# Patient Record
Sex: Female | Born: 1980 | Race: White | Hispanic: No | Marital: Single | Smoking: Former smoker
Health system: Southern US, Community
[De-identification: ages and names within clinical notes are randomized; demographics above are authoritative.]

## PROBLEM LIST (undated history)

## (undated) HISTORY — PX: LEEP: SHX91

---

## 2021-12-06 ENCOUNTER — Encounter: Payer: Self-pay | Admitting: Medical

## 2021-12-06 ENCOUNTER — Ambulatory Visit: Payer: Medicaid Other | Admitting: Medical

## 2021-12-06 VITALS — BP 113/56 | HR 55 | Temp 98.2°F | Resp 18 | Ht 66.0 in | Wt 186.0 lb

## 2021-12-06 DIAGNOSIS — F3289 Other specified depressive episodes: Secondary | ICD-10-CM | POA: Diagnosis not present

## 2021-12-06 DIAGNOSIS — R87618 Other abnormal cytological findings on specimens from cervix uteri: Secondary | ICD-10-CM

## 2021-12-06 DIAGNOSIS — R519 Headache, unspecified: Secondary | ICD-10-CM

## 2021-12-06 DIAGNOSIS — Z0000 Encounter for general adult medical examination without abnormal findings: Secondary | ICD-10-CM

## 2021-12-06 DIAGNOSIS — R5383 Other fatigue: Secondary | ICD-10-CM | POA: Diagnosis not present

## 2021-12-06 DIAGNOSIS — E669 Obesity, unspecified: Secondary | ICD-10-CM

## 2021-12-06 NOTE — Patient Instructions (Addendum)
For you wellness exam today I have ordered cbc, cmp and lipid panel.  Adding fatigue labs today as well.   Flu vaccine declined.  Recommend exercise and healthy diet.  We will let you know lab results as they come in.  Follow up date appointment will be determined after lab review.    For depression that is stable continue wellbutrin. Will monitor closely. If mood worsens then consider referral to psychiatrist.  For abnormal pap placed referral to gynecologist.  For your atypical head pressure/twitching and burning sensation and transient second blurry vision decided to go ahead and place referral to neurologist.  I think  hey will do work-up and that would probably include EEG.  No known history of seizures but this could be in the differential.  I do think it beneficial to find out in light of the fact that you are on Wellbutrin.  You have tried various antidepressants in the past with no success.  So would recommend continuing Wellbutrin.  If you have any obvious overt seizure type symptoms then I would recommend stopping the Wellbutrin.  Presently recommend continuing pending neurologist work-up.     Preventive Care 10-61 Years Old, Female Preventive care refers to lifestyle choices and visits with your health care provider that can promote health and wellness. Preventive care visits are also called wellness exams. What can I expect for my preventive care visit? Counseling During your preventive care visit, your health care provider may ask about your: Medical history, including: Past medical problems. Family medical history. Pregnancy history. Current health, including: Menstrual cycle. Method of birth control. Emotional well-being. Home life and relationship well-being. Sexual activity and sexual health. Lifestyle, including: Alcohol, nicotine or tobacco, and drug use. Access to firearms. Diet, exercise, and sleep habits. Work and work Statistician. Sunscreen  use. Safety issues such as seatbelt and bike helmet use. Physical exam Your health care provider may check your: Height and weight. These may be used to calculate your BMI (body mass index). BMI is a measurement that tells if you are at a healthy weight. Waist circumference. This measures the distance around your waistline. This measurement also tells if you are at a healthy weight and may help predict your risk of certain diseases, such as type 2 diabetes and high blood pressure. Heart rate and blood pressure. Body temperature. Skin for abnormal spots. What immunizations do I need? Vaccines are usually given at various ages, according to a schedule. Your health care provider will recommend vaccines for you based on your age, medical history, and lifestyle or other factors, such as travel or where you work. What tests do I need? Screening Your health care provider may recommend screening tests for certain conditions. This may include: Pelvic exam and Pap test. Lipid and cholesterol levels. Diabetes screening. This is done by checking your blood sugar (glucose) after you have not eaten for a while (fasting). Hepatitis B test. Hepatitis C test. HIV (human immunodeficiency virus) test. STI (sexually transmitted infection) testing, if you are at risk. BRCA-related cancer screening. This may be done if you have a family history of breast, ovarian, tubal, or peritoneal cancers. Talk with your health care provider about your test results, treatment options, and if necessary, the need for more tests. Follow these instructions at home: Eating and drinking  Eat a healthy diet that includes fresh fruits and vegetables, whole grains, lean protein, and low-fat dairy products. Take vitamin and mineral supplements as recommended by your health care provider. Do not drink alcohol  if: Your health care provider tells you not to drink. You are pregnant, may be pregnant, or are planning to become  pregnant. If you drink alcohol: Limit how much you have to 0-1 drink a day. Know how much alcohol is in your drink. In the [place] one drink equals one 12 oz bottle of beer (355 mL), one 5 oz glass of wine (148 mL), or one 1 oz glass of hard liquor (44 mL). Lifestyle Brush your teeth every morning and night with fluoride toothpaste. Floss one time each day. Exercise for at least 30 minutes 5 or more days each week. Do not use any products that contain nicotine or tobacco. These products include cigarettes, chewing tobacco, and vaping devices, such as e-cigarettes. If you need help quitting, ask your health care provider. Do not use drugs. If you are sexually active, practice safe sex. Use a condom or other form of protection to prevent STIs. If you do not wish to become pregnant, use a form of birth control. If you plan to become pregnant, see your health care provider for a prepregnancy visit. Find healthy ways to manage stress, such as: Meditation, yoga, or listening to music. Journaling. Talking to a trusted person. Spending time with friends and family. Minimize exposure to UV radiation to reduce your risk of skin cancer. Safety Always wear your seat belt while driving or riding in a vehicle. Do not drive: If you have been drinking alcohol. Do not ride with someone who has been drinking. If you have been using any mind-altering substances or drugs. While texting. When you are tired or distracted. Wear a helmet and other protective equipment during sports activities. If you have firearms in your house, make sure you follow all gun safety procedures. Seek help if you have been physically or sexually abused. What's next? Go to your health care provider once a year for an annual wellness visit. Ask your health care provider how often you should have your eyes and teeth checked. Stay up to date on all vaccines. This information is not intended to replace advice given to you by your  health care provider. Make sure you discuss any questions you have with your health care provider. Document Revised: 05/01/2021 Document Reviewed: 05/01/2021 Elsevier Patient Education  [place].

## 2021-12-06 NOTE — Progress Notes (Addendum)
Subjective:    Patient ID: Kristen Gibbs, female    DOB: [birth year], 41 y.o.   MRN: [id number]  HPI  Pt in for first time. Decided to go ahead and do wellness exam today.    She just moved to area. Move from [place] to [place]. Pt not working presently. Pt wants to exercise regularly.   Up to date on tdap. Flu vaccine and covid vaccine postponed. Due for repeat pap.  Pt wants to loose weight. She states has tried but has been unsuccessful. She states with diet and exercise for one year lost at most lost 5 lbs.   She states week of Thanksgiving she gained weight back.   Pt states just want to lose 15-20 lbs.   Pt has left side adnexal pain. Hx of cyst before and she states has had before.   Also has hx of abnormal pap. She was supposed to have repeat pap after leep procedure. She is overdue for pap.  Depression- controlled with wellbutrin. Has been on med for more than a year.Pt last primary care was handling.   Pt mentioned at end of exam she has random fluttering brain sensation(head pressure) and very slight transient blurred vision. This has happened for 2 months. Feels off and confused for 2 seconds. No hx of brain trauma. No known hx of seizure. Has happened 3 times. No ha after event. No arm twichting.  Evaluate and treat.  Review of Systems  Constitutional:  Positive for fatigue. Negative for chills and fever.       Can't loose weight.   Minimal fatigue now but had been worse before used wellbutrin.  HENT:  Negative for congestion, drooling, ear pain, facial swelling and hearing loss.   Respiratory:  Negative for cough, chest tightness, shortness of breath and wheezing.   Cardiovascular:  Negative for chest pain and palpitations.  Gastrointestinal:  Negative for abdominal pain, blood in stool, constipation and diarrhea.  Genitourinary:  Negative for dysuria and flank pain.  Musculoskeletal:  Negative for back pain, gait problem and neck pain.  Skin:   Negative for rash.  Neurological:  Negative for dizziness, numbness and headaches.  Hematological:  Negative for adenopathy. Does not bruise/bleed easily.  Psychiatric/Behavioral:  Negative for behavioral problems, dysphoric mood, hallucinations and suicidal ideas. The patient is not nervous/anxious.    No past medical history on file.   Social History   Socioeconomic History   Marital status: Single    Spouse name: Not on file   Number of children: Not on file   Years of education: Not on file   Highest education level: Not on file  Occupational History   Not on file  Tobacco Use   Smoking status: Former    Packs/day: 0.50    Years: 10.00    Pack years: 5.00    Types: Cigarettes    Start date: 08/20/2004    Quit date: 08/22/2014    Years since quitting: 7.2   Smokeless tobacco: Never  Substance and Sexual Activity   Alcohol use: Yes    Alcohol/week: 1.0 standard drink    Types: 1 Cans of beer per week   Drug use: Not on file   Sexual activity: Not on file  Other Topics Concern   Not on file  Social History Narrative   Not on file   Social Determinants of Health   Financial Resource Strain: Not on file  Food Insecurity: Not on file  Transportation Needs: Not on file  Physical Activity: Not on file  Stress: Not on file  Social Connections: Not on file  Intimate Partner Violence: Not on file      No family history on file.  Not on File  Current Outpatient Medications on File Prior to Visit  Medication Sig Dispense Refill   esomeprazole (NEXIUM) 20 MG capsule Take 20 mg by mouth daily at 12 noon.     fluticasone (FLONASE) 50 MCG/ACT nasal spray 1 spray by Each Nare route daily.     gabapentin (NEURONTIN) 300 MG capsule Take by mouth.     meclizine (ANTIVERT) 25 MG tablet TAKE 1 TABLET(25 MG) BY MOUTH THREE TIMES DAILY AS NEEDED FOR DIZZINESS OR NAUSEA     naproxen (NAPROSYN) 500 MG tablet Take by mouth.     buPROPion (WELLBUTRIN) 75 MG tablet Take 75 mg by  mouth 2 (two) times daily.     cetirizine (ZYRTEC) 10 MG tablet Take 10 mg by mouth daily.     No current facility-administered medications on file prior to visit.    BP (!) 113/56    Pulse (!) 55    Temp 98.2 F (36.8 C)    Resp 18    Ht 5\' 6"  (1.676 m)    Wt 186 lb (84.4 kg)    LMP 11/30/2021    SpO2 100%    BMI 30.02 kg/m          Objective:   Physical Exam  General Mental Status- Alert. General Appearance- Not in acute distress.   Skin General: Color- Normal Color. Moisture- Normal Moisture.  Neck Carotid Arteries- Normal color. Moisture- Normal Moisture. No carotid bruits. No JVD.  Chest and Lung Exam Auscultation: Breath Sounds:-Normal.  Cardiovascular Auscultation:Rythm- Regular. Murmurs & Other Heart Sounds:Auscultation of the heart reveals- No Murmurs.  Abdomen Inspection:-Inspeection Normal. Palpation/Percussion:Note:No mass. Palpation and Percussion of the abdomen reveal- Non Tender, Non Distended + BS, no rebound or guarding.   Neurologic Cranial Nerve exam:- CN III-XII intact(No nystagmus), symmetric smile. Strength:- 5/5 equal and symmetric strength both upper and lower extremities.       Assessment & Plan:   Patient Instructions  For you wellness exam today I have ordered cbc, cmpand lipid panel.  Adding fatigue labs today as well.   Flu vaccine declined.  Recommend exercise and healthy diet.  We will let you know lab results as they come in.  Follow up date appointment will be determined after lab review.    For depression that is stable continue wellbutrin.   For abnoromal pap placed referral to gynecologist.     Mackie Pai, PA-C    Time spent with patient today was  52 minutes which consisted of chart review, discussing diagnosis, work up ,treatment and documentation.

## 2021-12-06 NOTE — Addendum Note (Signed)
Addended by: Mervin Kung A on: 12/06/2021 04:04 PM   Modules accepted: Orders

## 2021-12-07 LAB — CBC WITH DIFFERENTIAL/PLATELET
Absolute Monocytes: 394 {cells}/uL (ref 200–950)
Basophils Absolute: 44 {cells}/uL (ref 0–200)
Basophils Relative: 0.6 %
Eosinophils Absolute: 102 {cells}/uL (ref 15–500)
Eosinophils Relative: 1.4 %
HCT: 38.9 % (ref 35.0–45.0)
Hemoglobin: 12.9 g/dL (ref 11.7–15.5)
Lymphs Abs: 3526 {cells}/uL (ref 850–3900)
MCH: 30.8 pg (ref 27.0–33.0)
MCHC: 33.2 g/dL (ref 32.0–36.0)
MCV: 92.8 fL (ref 80.0–100.0)
MPV: 11.5 fL (ref 7.5–12.5)
Monocytes Relative: 5.4 %
Neutro Abs: 3234 {cells}/uL (ref 1500–7800)
Neutrophils Relative %: 44.3 %
Platelets: 296 Thousand/uL (ref 140–400)
RBC: 4.19 Million/uL (ref 3.80–5.10)
RDW: 11.8 % (ref 11.0–15.0)
Total Lymphocyte: 48.3 %
WBC: 7.3 Thousand/uL (ref 3.8–10.8)

## 2021-12-07 LAB — LIPID PANEL
Cholesterol: 149 mg/dL (ref <200)
HDL: 53 mg/dL (ref >=50)
LDL Cholesterol (Calc): 82 mg/dL (calc)
Non-HDL Cholesterol (Calc): 96 mg/dL (calc) (ref <130)
Total CHOL/HDL Ratio: 2.8 (calc) (ref <5.0)
Triglycerides: 63 mg/dL (ref <150)

## 2021-12-07 LAB — COMPREHENSIVE METABOLIC PANEL WITH GFR
AG Ratio: 1.7 (calc) (ref 1.0–2.5)
ALT: 26 U/L (ref 6–29)
AST: 25 U/L (ref 10–30)
Albumin: 5 g/dL (ref 3.6–5.1)
Alkaline phosphatase (APISO): 54 U/L (ref 31–125)
BUN: 14 mg/dL (ref 7–25)
CO2: 26 mmol/L (ref 20–32)
Calcium: 10 mg/dL (ref 8.6–10.2)
Chloride: 104 mmol/L (ref 98–110)
Creat: 0.92 mg/dL (ref 0.50–0.97)
Globulin: 3 g/dL (ref 1.9–3.7)
Glucose, Bld: 88 mg/dL (ref 65–99)
Potassium: 4.2 mmol/L (ref 3.5–5.3)
Sodium: 139 mmol/L (ref 135–146)
Total Bilirubin: 0.4 mg/dL (ref 0.2–1.2)
Total Protein: 8 g/dL (ref 6.1–8.1)

## 2021-12-07 LAB — TSH: TSH: 2.02 mIU/L

## 2021-12-07 LAB — T4, FREE: Free T4: 1 ng/dL (ref 0.8–1.8)

## 2021-12-07 LAB — VITAMIN B12: Vitamin B-12: 495 pg/mL (ref 200–1100)

## 2021-12-07 LAB — VITAMIN D 25 HYDROXY (VIT D DEFICIENCY, FRACTURES): Vit D, 25-Hydroxy: 31 ng/mL (ref 30–100)

## 2021-12-10 ENCOUNTER — Encounter: Payer: Self-pay | Admitting: Medical

## 2021-12-11 ENCOUNTER — Encounter: Payer: Self-pay | Admitting: Neurology

## 2021-12-11 ENCOUNTER — Other Ambulatory Visit: Payer: Self-pay

## 2021-12-11 ENCOUNTER — Ambulatory Visit: Payer: Medicaid Other | Admitting: Neurology

## 2021-12-11 VITALS — BP 110/62 | HR 60 | Ht 66.5 in | Wt 186.0 lb

## 2021-12-11 DIAGNOSIS — R29818 Other symptoms and signs involving the nervous system: Secondary | ICD-10-CM

## 2021-12-11 LAB — VITAMIN B1: Vitamin B1 (Thiamine): 9 nmol/L (ref 8–30)

## 2021-12-11 NOTE — Progress Notes (Signed)
NEUROLOGY CONSULTATION NOTE  Kristen Gibbs MRN: [id number] DOB: Jul 31, 1981  Referring provider: Esperanza Richters, PA-C Primary care provider:  Esperanza Richters, PA-C  Reason for consult:  pressure in head   Thank you for your kind referral of Kristen Gibbs for consultation of the above symptoms. Although her history is well known to you, please allow me to reiterate it for the purpose of our medical record. She is alone in the office today. Records and images were personally reviewed where available.   HISTORY OF PRESENT ILLNESS: This is a 41 year old right-handed woman with a history of depression presenting for evaluation of 3 episodes where she feels like her "brain moves, similar sensation to baby moving in womb when pregnant," with blurred vision for 2-3 seconds. They occur while sitting or standing. The first episode occurred in November, last episode a couple of weeks ago. Twice they occurred while talking to her son, she was surprised so she paused for a minute and her son asked if she was okay. She wanted to say something but couldn't, "just froze." There was no associated headache, dizziness, focal numbness/tingling/weakness. She denies any olfactory/gustatory hallucinations, myoclonic jerks. No clear triggers. She usually gets 4-8 hours of sleep. She has constant neck and back pain, taking prn gabapentin around once a month. She has been on Wellbutrin for a while which makes her "really spacey, like in another world 90% of the time." She has to really focus to not drift off. She has noticed loss of time "all the time" that she attributes to medication. She notes that hours would go by, she thinks she just spaces out and thinks of other things. This has been going on for years. Her memory is "awful." She had a normal birth and early development. No history of febrile convulsions, CNS infections, significant traumatic brain injury, neurosurgical procedures, or family history of seizures.      Lab Results  Component Value Date   TSH 2.02 12/06/2021   Lab Results  Component Value Date   VITAMINB12 495 12/06/2021    PAST MEDICAL HISTORY: History reviewed. No pertinent past medical history.  PAST SURGICAL HISTORY: History reviewed. No pertinent surgical history.  MEDICATIONS: Current Outpatient Medications on File Prior to Visit  Medication Sig Dispense Refill   buPROPion (WELLBUTRIN) 75 MG tablet Take 75 mg by mouth 2 (two) times daily.     cetirizine (ZYRTEC) 10 MG tablet Take 10 mg by mouth daily.     esomeprazole (NEXIUM) 20 MG capsule Take 20 mg by mouth daily at 12 noon. (Patient not taking: Reported on 12/11/2021)     fluticasone (FLONASE) 50 MCG/ACT nasal spray 1 spray by Each Nare route daily. (Patient not taking: Reported on 12/11/2021)     gabapentin (NEURONTIN) 300 MG capsule Take by mouth. (Patient not taking: Reported on 12/11/2021)     meclizine (ANTIVERT) 25 MG tablet TAKE 1 TABLET(25 MG) BY MOUTH THREE TIMES DAILY AS NEEDED FOR DIZZINESS OR NAUSEA (Patient not taking: Reported on 12/11/2021)     naproxen (NAPROSYN) 500 MG tablet Take by mouth. (Patient not taking: Reported on 12/11/2021)     No current facility-administered medications on file prior to visit.    ALLERGIES: No Known Allergies  FAMILY HISTORY: Family History  Problem Relation Age of Onset   Hypertension Mother    Hypertension Father    Diabetes Maternal Grandmother    Diabetes Maternal Aunt     SOCIAL HISTORY: Social History   Socioeconomic History  Marital status: Single    Spouse name: Not on file   Number of children: Not on file   Years of education: Not on file   Highest education level: Not on file  Occupational History   Not on file  Tobacco Use   Smoking status: Former    Packs/day: 0.50    Years: 10.00    Pack years: 5.00    Types: Cigarettes    Start date: 08/20/2004    Quit date: 08/22/2014    Years since quitting: 7.3   Smokeless tobacco: Never  Vaping  Use   Vaping Use: Never used  Substance and Sexual Activity   Alcohol use: Yes    Alcohol/week: 1.0 standard drink    Types: 1 Cans of beer per week    Comment: soc   Drug use: Never   Sexual activity: Not on file  Other Topics Concern   Not on file  Social History Narrative   Right handed    Social Determinants of Health   Financial Resource Strain: Not on file  Food Insecurity: Not on file  Transportation Needs: Not on file  Physical Activity: Not on file  Stress: Not on file  Social Connections: Not on file  Intimate Partner Violence: Not on file     PHYSICAL EXAM: Vitals:   12/11/21 1401  BP: 110/62  Pulse: 60  SpO2: 99%   General: No acute distress Head:  Normocephalic/atraumatic Skin/Extremities: No rash, no edema Neurological Exam: Mental status: alert and oriented to person, place, and time, no dysarthria or aphasia, Fund of knowledge is appropriate.  Recent and remote memory are intact, 3/3 delayed recall.  Attention and concentration are normal, 5/5 WORLD backwards. Cranial nerves: CN I: not tested CN II: pupils equal, round and reactive to light, visual fields intact CN III, IV, VI:  full range of motion, no nystagmus, no ptosis CN V: facial sensation intact CN VII: upper and lower face symmetric CN VIII: hearing intact to conversation CN IX, X: gag intact, uvula midline CN XI: sternocleidomastoid and trapezius muscles intact CN XII: tongue midline Bulk & Tone: normal, no fasciculations. Motor: 5/5 throughout with no pronator drift. Sensation: intact to light touch, cold, pin, vibration sense.  No extinction to double simultaneous stimulation.  Romberg test negative Deep Tendon Reflexes: +2 throughout Cerebellar: no incoordination on finger to nose testing Gait: narrow-based and steady, able to tandem walk adequately. Tremor: none   IMPRESSION: This is a 41 year old right-handed woman with a history of depression on Wellbutrin for many years,  presenting for evaluation of 3 episodes where she feels a "flutter" in her head with blurred vision and difficulty speaking. She also notes losing time and being "spacey" for many years, attributed to Wellbutrin. Her neurological exam is normal, etiology of symptoms unclear. We discussed doing a 1-hour EEG. We may consider doing prolonged EEG if needed to further classify her symptoms. Follow-up in 3-4 months, call for any changes.   Thank you for allowing me to participate in the care of this patient. Please do not hesitate to call for any questions or concerns.   Patrcia Dolly, M.D.  CC: Esperanza Richters, PA-C

## 2021-12-11 NOTE — Patient Instructions (Signed)
Schedule 1-hour EEG.  Continue to monitor symptoms, we may do a prolonged EEG if needed  2. Follow-up in 3-4 months, call for any changes

## 2021-12-12 MED ORDER — CETIRIZINE HCL 10 MG PO TABS: 10.0000 mg | ORAL_TABLET | Freq: Every day | ORAL | 3 refills | Status: DC

## 2021-12-12 MED ORDER — BUPROPION HCL 75 MG PO TABS: 75.0000 mg | ORAL_TABLET | Freq: Two times a day (BID) | ORAL | 3 refills | Status: DC

## 2021-12-12 NOTE — Telephone Encounter (Signed)
Can you send in patient's medications , last filled by previous provider with no sig

## 2021-12-12 NOTE — Addendum Note (Signed)
Addended by: Gwenevere Abbot on: 12/12/2021 06:34 PM   Modules accepted: Orders

## 2021-12-20 ENCOUNTER — Telehealth: Payer: Self-pay | Admitting: Medical

## 2021-12-20 MED ORDER — GABAPENTIN 300 MG PO CAPS: 300.0000 mg | ORAL_CAPSULE | Freq: Every day | ORAL | 5 refills | Status: DC

## 2021-12-20 NOTE — Telephone Encounter (Signed)
Rx gabapentin sent to pt pharmacy.  Esperanza Richters, PA-C

## 2022-01-02 ENCOUNTER — Other Ambulatory Visit: Payer: Medicaid Other

## 2022-01-14 ENCOUNTER — Encounter: Payer: Self-pay | Admitting: Medical

## 2022-01-14 MED ORDER — METFORMIN HCL 500 MG PO TABS: 500.0000 mg | ORAL_TABLET | Freq: Two times a day (BID) | ORAL | 3 refills | Status: DC

## 2022-01-14 NOTE — Addendum Note (Signed)
Addended by: Gwenevere Abbot on: 01/14/2022 05:27 PM   Modules accepted: Orders

## 2022-01-15 ENCOUNTER — Telehealth: Payer: Medicaid Other | Admitting: Physician Assistant

## 2022-01-15 NOTE — Progress Notes (Signed)
Feel patient scheduled in error after reviewing messages with her PCP. She did not show for visit so unable to verify but message was sent to patient.  ?

## 2022-01-24 ENCOUNTER — Encounter: Payer: Self-pay | Admitting: Medical

## 2022-01-26 ENCOUNTER — Telehealth: Payer: Medicaid Other | Admitting: Physician Assistant

## 2022-01-26 DIAGNOSIS — R0981 Nasal congestion: Secondary | ICD-10-CM

## 2022-01-26 MED ORDER — AMOXICILLIN-POT CLAVULANATE 875-125 MG PO TABS: 1.0000 | ORAL_TABLET | Freq: Two times a day (BID) | ORAL | 0 refills | Status: DC

## 2022-01-26 NOTE — Progress Notes (Signed)
?Virtual Visit Consent  ? ?Kristen Gibbs, you are scheduled for a virtual visit with a [facility] provider today.   ?  ?Just as with appointments in the office, your consent must be obtained to participate.  Your consent will be active for this visit and any virtual visit you may have with one of our providers in the next 365 days.   ?  ?If you have a MyChart account, a copy of this consent can be sent to you electronically.  All virtual visits are billed to your insurance company just like a traditional visit in the office.   ? ?As this is a virtual visit, video technology does not allow for your provider to perform a traditional examination.  This may limit your provider's ability to fully assess your condition.  If your provider identifies any concerns that need to be evaluated in person or the need to arrange testing (such as labs, EKG, etc.), we will make arrangements to do so.   ?  ?Although advances in technology are sophisticated, we cannot ensure that it will always work on either your end or our end.  If the connection with a video visit is poor, the visit may have to be switched to a telephone visit.  With either a video or telephone visit, we are not always able to ensure that we have a secure connection.    ? ?I need to obtain your verbal consent now.   Are you willing to proceed with your visit today?  ?  ?Kristen Gibbs has provided verbal consent on 01/26/2022 for a virtual visit (video or telephone). ?  ?Jarold Motto, PA  ? ?Date: 01/26/2022 2:14 PM ? ? ?Virtual Visit via Video Note  ? ?IJarold Motto, connected with  Kristen Gibbs  ([id number], 1981/10/12) on 01/26/22 at  2:00 PM EDT by a video-enabled telemedicine application and verified that I am speaking with the correct person using two identifiers. ? ?Location: ?Patient: Virtual Visit Location Patient: Home ?Provider: Virtual Visit Location Provider: Home Office ?  ?I discussed the limitations of evaluation and management by  telemedicine and the availability of in person appointments. The patient expressed understanding and agreed to proceed.   ? ?History of Present Illness: ?Kristen Gibbs is a 41 y.o. who identifies as a female who was assigned female at birth, and is being seen today for sinus congestion. ? ?Has had symptoms for at least 5 days. She has PND, sinus pressure. Denies sick contacts. Has not taken COVID test. Subjective fever the first day. Denies concerns for pregnancy, SOB, body aches, chills, poor appetite. Taking mucinex and benadryl without relief of symptoms. ? ?HPI: HPI  ?Problems: There are no problems to display for this patient. ?  ?Allergies: No Known Allergies ?Medications:  ?Current Outpatient Medications:  ?  amoxicillin-clavulanate (AUGMENTIN) 875-125 MG tablet, Take 1 tablet by mouth 2 (two) times daily., Disp: 14 tablet, Rfl: 0 ?  buPROPion (WELLBUTRIN) 75 MG tablet, Take 1 tablet (75 mg total) by mouth 2 (two) times daily., Disp: 60 tablet, Rfl: 3 ?  cetirizine (ZYRTEC) 10 MG tablet, Take 1 tablet (10 mg total) by mouth daily., Disp: 30 tablet, Rfl: 3 ?  esomeprazole (NEXIUM) 20 MG capsule, Take 20 mg by mouth daily at 12 noon. (Patient not taking: Reported on 12/11/2021), Disp: , Rfl:  ?  fluticasone (FLONASE) 50 MCG/ACT nasal spray, 1 spray by Each Nare route daily. (Patient not taking: Reported on 12/11/2021), Disp: , Rfl:  ?  gabapentin (NEURONTIN)  300 MG capsule, Take 1 capsule (300 mg total) by mouth at bedtime., Disp: 30 capsule, Rfl: 5 ?  meclizine (ANTIVERT) 25 MG tablet, TAKE 1 TABLET(25 MG) BY MOUTH THREE TIMES DAILY AS NEEDED FOR DIZZINESS OR NAUSEA (Patient not taking: Reported on 12/11/2021), Disp: , Rfl:  ?  metFORMIN (GLUCOPHAGE) 500 MG tablet, Take 1 tablet (500 mg total) by mouth 2 (two) times daily with a meal., Disp: 60 tablet, Rfl: 3 ?  naproxen (NAPROSYN) 500 MG tablet, Take by mouth. (Patient not taking: Reported on 12/11/2021), Disp: , Rfl:  ? ?Observations/Objective: ?Patient is  well-developed, well-nourished in no acute distress.  ?Resting comfortably  at home.  ?Head is normocephalic, atraumatic.  ?No labored breathing.  ?Speech is clear and coherent with logical content.  ?Patient is alert and oriented at baseline.  ? ? ?Assessment and Plan: ?1. Sinus congestion ?No red flags on discussion.  Will initiate augmentin per orders. Discussed taking medications as prescribed. Reviewed return precautions including worsening fever, SOB, worsening cough or other concerns. Push fluids and rest. I recommend that patient follow-up if symptoms worsen or persist despite treatment x 7-10 days, sooner if needed. ? ? ?Follow Up Instructions: ?I discussed the assessment and treatment plan with the patient. The patient was provided an opportunity to ask questions and all were answered. The patient agreed with the plan and demonstrated an understanding of the instructions.  A copy of instructions were sent to the patient via MyChart unless otherwise noted below.  ? ? ?The patient was advised to call back or seek an in-person evaluation if the symptoms worsen or if the condition fails to improve as anticipated. ? ?Time:  ?I spent 5-10 minutes with the patient via telehealth technology discussing the above problems/concerns.   ? ?Jarold Motto, PA ? ?

## 2022-02-06 ENCOUNTER — Other Ambulatory Visit (HOSPITAL_COMMUNITY)
Admission: RE | Admit: 2022-02-06 | Discharge: 2022-02-06 | Disposition: A | Discharge status: Home / Self Care | Payer: Medicaid Other | Source: Ambulatory Visit | Attending: Family Medicine | Admitting: Family Medicine

## 2022-02-06 ENCOUNTER — Ambulatory Visit: Payer: Medicaid Other | Admitting: Family Medicine

## 2022-02-06 ENCOUNTER — Ambulatory Visit (HOSPITAL_BASED_OUTPATIENT_CLINIC_OR_DEPARTMENT_OTHER)
Admission: RE | Admit: 2022-02-06 | Discharge: 2022-02-06 | Disposition: A | Discharge status: Home / Self Care | Payer: Medicaid Other | Source: Ambulatory Visit | Attending: Medical | Admitting: Medical

## 2022-02-06 ENCOUNTER — Telehealth (INDEPENDENT_AMBULATORY_CARE_PROVIDER_SITE_OTHER): Payer: Medicaid Other | Admitting: Medical

## 2022-02-06 ENCOUNTER — Encounter: Payer: Self-pay | Admitting: Family Medicine

## 2022-02-06 ENCOUNTER — Other Ambulatory Visit: Payer: Self-pay

## 2022-02-06 VITALS — BP 119/80 | HR 62

## 2022-02-06 VITALS — BP 119/80 | HR 65 | Ht 67.0 in | Wt 183.0 lb

## 2022-02-06 DIAGNOSIS — Z01419 Encounter for gynecological examination (general) (routine) without abnormal findings: Secondary | ICD-10-CM

## 2022-02-06 DIAGNOSIS — M5416 Radiculopathy, lumbar region: Secondary | ICD-10-CM | POA: Diagnosis not present

## 2022-02-06 DIAGNOSIS — M792 Neuralgia and neuritis, unspecified: Secondary | ICD-10-CM | POA: Diagnosis present

## 2022-02-06 DIAGNOSIS — M5441 Lumbago with sciatica, right side: Secondary | ICD-10-CM

## 2022-02-06 DIAGNOSIS — M542 Cervicalgia: Secondary | ICD-10-CM | POA: Diagnosis present

## 2022-02-06 DIAGNOSIS — N926 Irregular menstruation, unspecified: Secondary | ICD-10-CM

## 2022-02-06 DIAGNOSIS — A630 Anogenital (venereal) warts: Secondary | ICD-10-CM

## 2022-02-06 DIAGNOSIS — G8929 Other chronic pain: Secondary | ICD-10-CM

## 2022-02-06 DIAGNOSIS — Z9889 Other specified postprocedural states: Secondary | ICD-10-CM

## 2022-02-06 IMAGING — DX DG CERVICAL SPINE COMPLETE 4+V
6 series · 6 of 6 positions shown · non-contrast
Comparison: None.

CLINICAL DATA: Bilateral cervical pain

EXAM:
CERVICAL SPINE - COMPLETE 4+ VIEW

[c-spine lat]
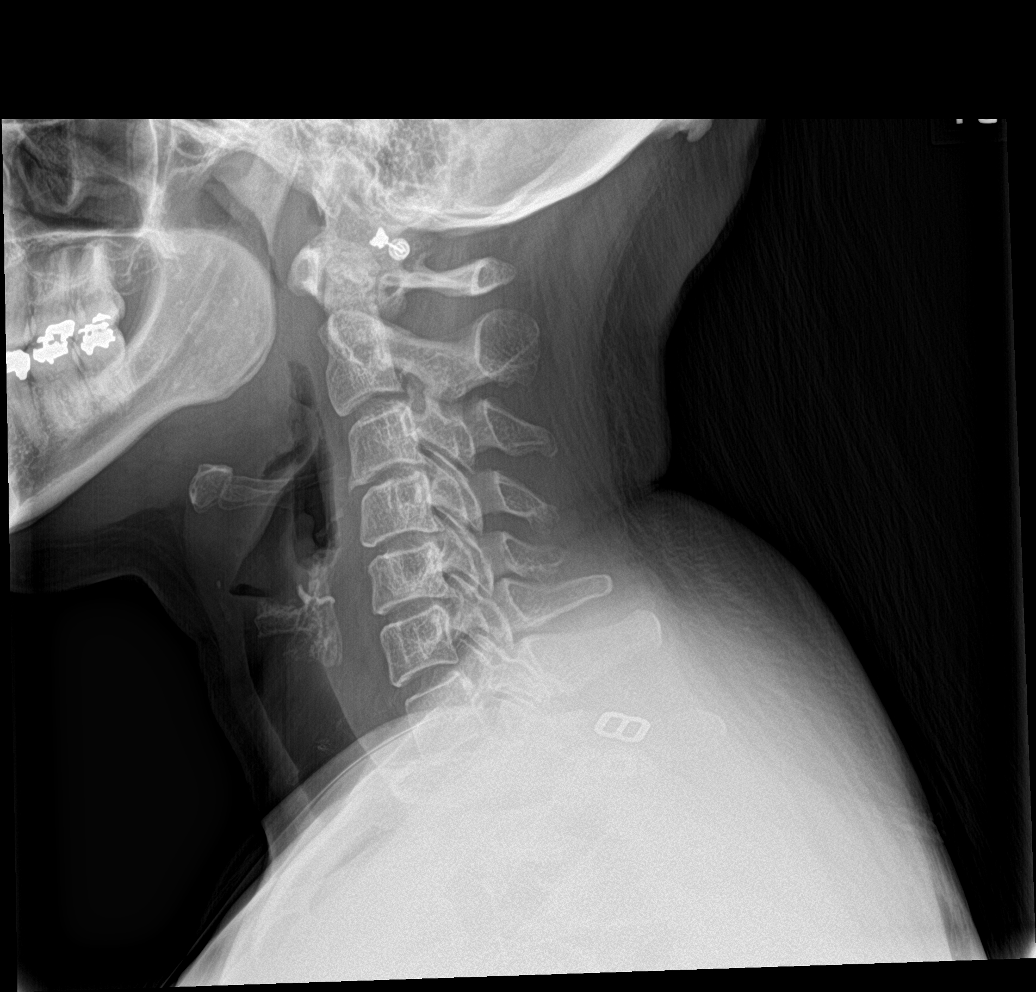

[c-spine obl (1 of 2)]
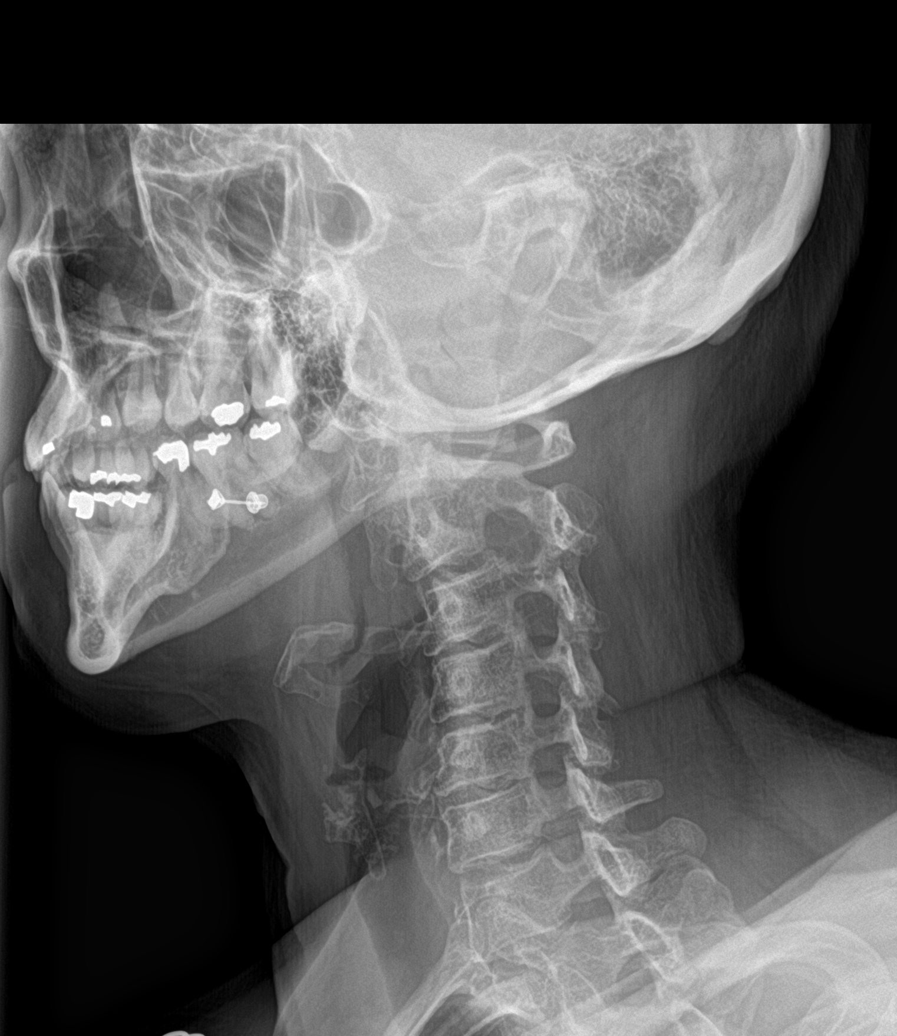

[c-spine obl (2 of 2)]
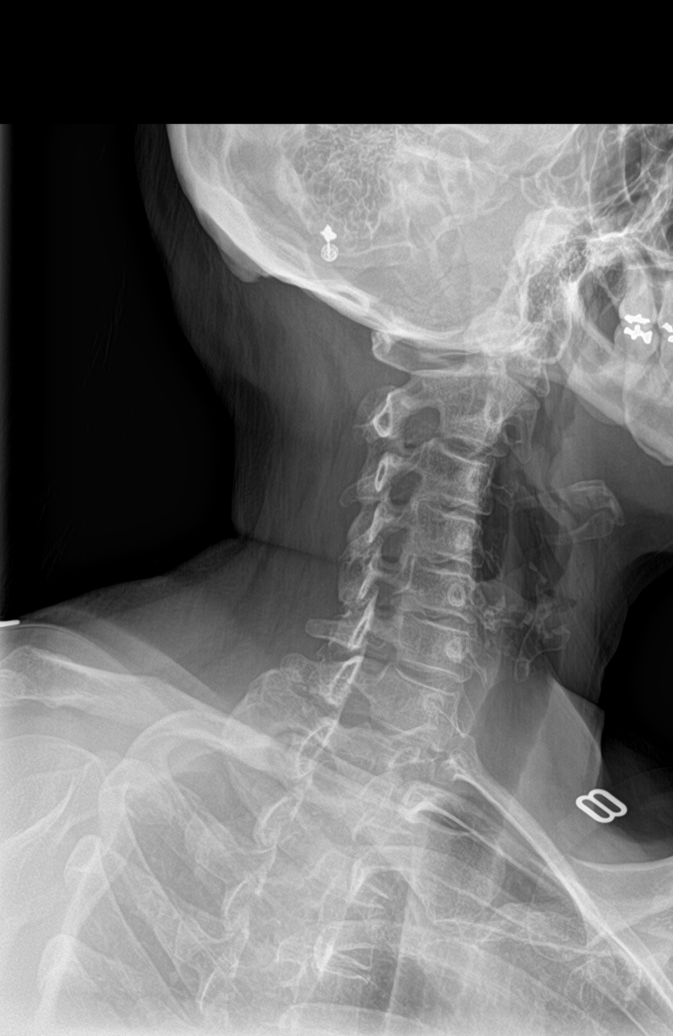

[c-spine open mouth]
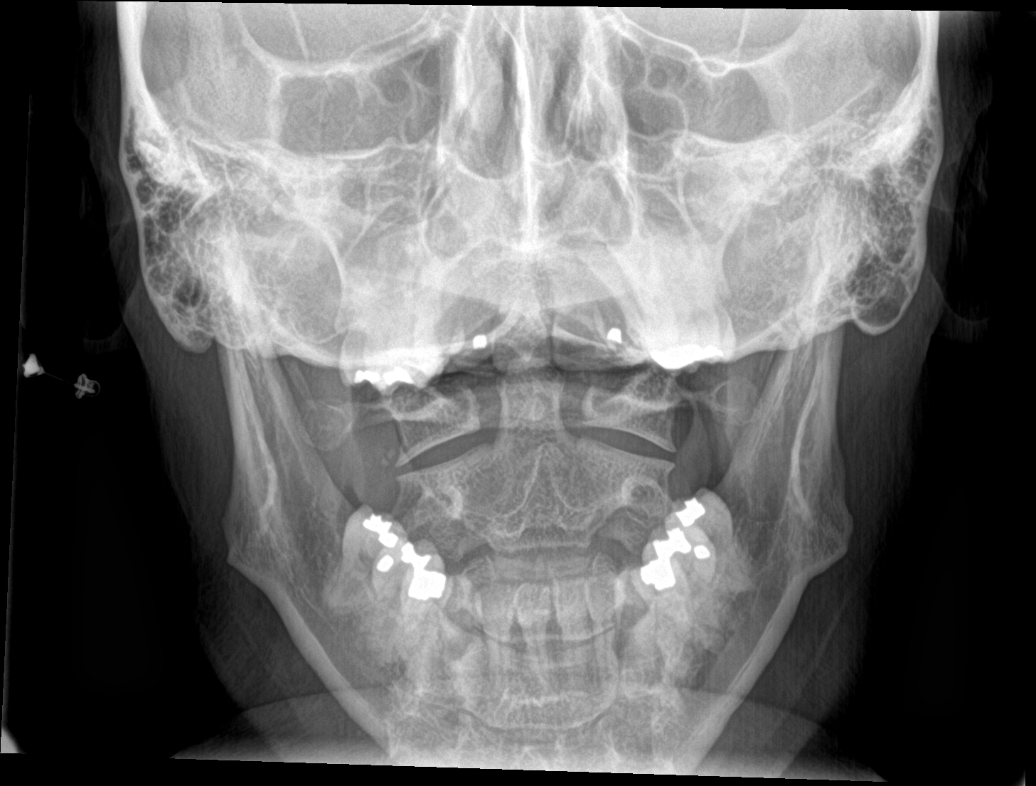

[c-spine swimmers]
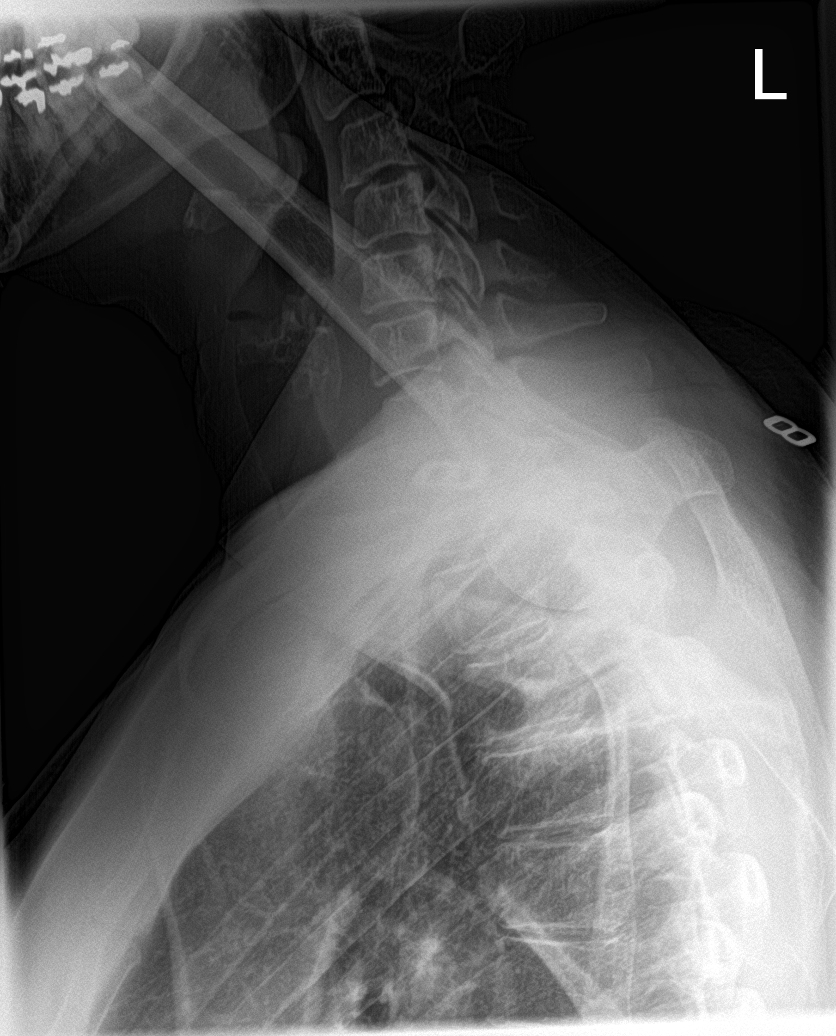

[c-spine ap]
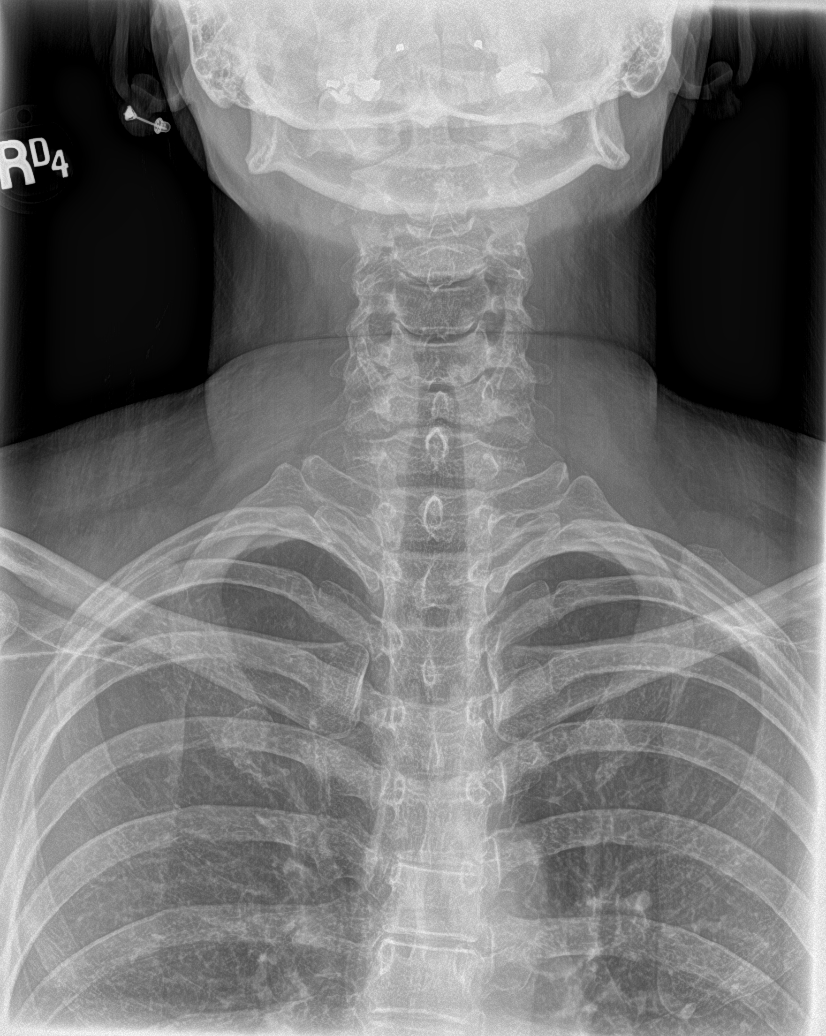

[6 of 6 positions shown; findings below may reference images not displayed]

FINDINGS: Mild reversal of cervical lordosis. Vertebral body heights are
maintained. Trace retrolisthesis C5 on C6. Foramen are patent
bilaterally. Dens and lateral masses are unremarkable. Mild disc
space narrowing and degenerative change C3-C4, C4-C5 and C5-C6
IMPRESSION: Mild reversal of cervical lordosis with degenerative changes C3
through C6

## 2022-02-06 MED ORDER — IMIQUIMOD 5 % EX CREA: TOPICAL_CREAM | CUTANEOUS | 3 refills | Status: DC

## 2022-02-06 NOTE — Progress Notes (Signed)
? ? ?GYNECOLOGY ANNUAL PREVENTATIVE CARE ENCOUNTER NOTE ? ?Subjective:  ? Kristen Gibbs is a 41 y.o. G86P1011 female here for a routine annual gynecologic exam.  Current complaints: Having irregular menses - bleeds for 7-10 days at times, sometimes 28-30 days between cycles, sometimes 14. Having severe cramping during periods. Started metformin 3 weeks ago, last period was more tolerable. No hirsutism. Tried to get pregnant for several years without success. No longer in a relationship. Denies abnormal vaginal bleeding, discharge, problems with intercourse or other gynecologic concerns.  ?  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 No followup since then. ? ?Gynecologic History ?Patient's last menstrual period was 01/26/2022. ?Patient is not sexually active  ?Contraception: none ?Last Pap: 2021. Results were: abnormal ?Last mammogram: n/a ? ? ?The pregnancy intention screening data noted above was reviewed. Potential methods of contraception were discussed. The patient elected to proceed with No data recorded. ? ? ?Obstetric History ?OB History  ?Gravida Para Term Preterm AB Living  ?2 1 1   1 1   ?SAB IAB Ectopic Multiple Live Births  ?1       1  ?  ?# Outcome Date GA Lbr Len/2nd Weight Sex Delivery Anes PTL Lv  ?2 SAB 2007          ?1 Term 03/11/03     Vag-Spont   LIV  ? ? ?History reviewed. No pertinent past medical history. ? ?History reviewed. No pertinent surgical history. ? ?Current Outpatient Medications on File Prior to Visit  ?Medication Sig Dispense Refill  ? buPROPion (WELLBUTRIN) 75 MG tablet Take 1 tablet (75 mg total) by mouth 2 (two) times daily. 60 tablet 3  ? cetirizine (ZYRTEC) 10 MG tablet Take 1 tablet (10 mg total) by mouth daily. 30 tablet 3  ? gabapentin (NEURONTIN) 300 MG capsule Take 1 capsule (300 mg total) by mouth at bedtime. 30 capsule 5  ? metFORMIN (GLUCOPHAGE) 500 MG tablet Take 1 tablet (500 mg total) by mouth 2 (two) times daily with a meal. 60 tablet 3  ? ?No current  facility-administered medications on file prior to visit.  ? ? ?No Known Allergies ? ?Social History  ? ?Socioeconomic History  ? Marital status: Single  ?  Spouse name: Not on file  ? Number of children: Not on file  ? Years of education: Not on file  ? Highest education level: Not on file  ?Occupational History  ? Not on file  ?Tobacco Use  ? Smoking status: Former  ?  Packs/day: 0.50  ?  Years: 10.00  ?  Pack years: 5.00  ?  Types: Cigarettes  ?  Start date: 08/20/2004  ?  Quit date: 08/22/2014  ?  Years since quitting: 7.4  ? Smokeless tobacco: Never  ?Vaping Use  ? Vaping Use: Never used  ?Substance and Sexual Activity  ? Alcohol use: Not Currently  ?  Alcohol/week: 1.0 standard drink  ?  Types: 1 Cans of beer per week  ?  Comment: soc  ? Drug use: Never  ? Sexual activity: Yes  ?  Birth control/protection: None  ?Other Topics Concern  ? Not on file  ?Social History Narrative  ? Right handed   ? ?Social Determinants of Health  ? ?Financial Resource Strain: Not on file  ?Food Insecurity: Not on file  ?Transportation Needs: Not on file  ?Physical Activity: Not on file  ?Stress: Not on file  ?Social Connections: Not on file  ?Intimate Partner Violence: Not  on file  ? ? ?Family History  ?Problem Relation Age of Onset  ? Hypertension Mother   ? Hypertension Father   ? Diabetes Maternal Grandmother   ? Diabetes Maternal Aunt   ? ? ?The following portions of the patient's history were reviewed and updated as appropriate: allergies, current medications, past family history, past medical history, past social history, past surgical history and problem list. ? ?Review of Systems ?Pertinent items are noted in HPI. ?  ?Objective:  ?BP 119/80   Pulse 65   Ht 5\' 7"  (1.702 m)   Wt 183 lb (83 kg)   LMP 01/26/2022   BMI 28.66 kg/m?  ?Wt Readings from Last 3 Encounters:  ?02/06/22 183 lb (83 kg)  ?12/11/21 186 lb (84.4 kg)  ?12/06/21 186 lb (84.4 kg)  ?  ? ?Chaperone present during exam ? ?CONSTITUTIONAL: Well-developed,  well-nourished female in no acute distress.  ?HENT:  Normocephalic, atraumatic, External right and left ear normal. Oropharynx is clear and moist ?EYES: Conjunctivae and EOM are normal. Pupils are equal, round, and reactive to light. No scleral icterus.  ?NECK: Normal range of motion, supple, no masses.  Normal thyroid.  ? ?CARDIOVASCULAR: Normal heart rate noted, regular rhythm ?RESPIRATORY: Clear to auscultation bilaterally. Effort and breath sounds normal, no problems with respiration noted. ?BREASTS: Symmetric in size. No masses, skin changes, nipple drainage, or lymphadenopathy. ?ABDOMEN: Soft, normal bowel sounds, no distention noted.  No tenderness, rebound or guarding.  ?PELVIC: 38mm flat wart on left vulva. Otherwise, normal appearing external genitalia; normal appearing vaginal mucosa and cervix.  No abnormal discharge noted.   ?MUSCULOSKELETAL: Normal range of motion. No tenderness.  No cyanosis, clubbing, or edema.  2+ distal pulses. ?SKIN: Skin is warm and dry. No rash noted. Not diaphoretic. No erythema. No pallor. ?NEUROLOGIC: Alert and oriented to person, place, and time. Normal reflexes, muscle tone coordination. No cranial nerve deficit noted. ?PSYCHIATRIC: Normal mood and affect. Normal behavior. Normal judgment and thought content. ? ?Assessment:  ?Annual gynecologic examination with pap smear ?  ?Plan:  ?1. Well Woman Exam ?Will follow up results of pap smear and manage accordingly. ?- Cytology - PAP( ) ? ?2. History of loop electrical excision procedure (LEEP) ?PAP today ? ?3. Condyloma acuminatum ?Will try aldara. If not helpful, then will do cryotherapy ? ?4. Irregular menstrual cycle ?Check hormone levels. Will allow a few months to see how effective metformin is with her menses, the progress if needed. F/u 3 months. ?- Estrogens, Total ?- Follicle stimulating hormone ?- TestT+TestF+SHBG ? ? ?Routine preventative health maintenance measures emphasized. ?Please refer to After Visit  Summary for other counseling recommendations.  ? ? ?4m, DO ?[facility] ? ?

## 2022-02-06 NOTE — Progress Notes (Signed)
? ?Subjective:  ? ? Patient ID: Kristen NickelsStephani Gibbs, female    DOB: [birth year], 41 y.o.   MRN: [id number] ? ?HPI ? ?Visit started by video but needed to order mri and thus though best to finish in person and examen since exam most likely needed for prior auth. ? ?Years of back pain. Pain present for 10 years per pt. But worse past 2 years. ? ?[facility] She was going to have mri but had issue with prior authorization. She never got that done and facility was not in convenient location. Former MD note ?04/19/2021 ?[facility] Note ?Encounter Provider: Denver FasterStephanie M Grasso, FNP ?Date of Service: 04/19/2021 ?[phone] ? ?Primary Care Provider: Ulla GalloJennifer M Martini, MD ? ?Kristen HumanStephanie N Gibbs is a 41 y.o. female  ?IMPRESSION:  ? ?Chronic R lumbar radiculitis which has been present for months (she thinks onset was early February) and started after patient was working in her garden. Has failed conservative treatment including PT, NSAIDs.  ? ?No red flags.  ? ?PE significant for decrease sensation RLE medial ankle, doral foot, lateral foot. 5/5 MMT. + RLE SLR.  ? ?MRI ordered for evaluation of lumbar radiculopathy present for greater than 6 months not responsive to conservative treatment with progression of symptoms.  ? ?ASSESSMENT/PLAN  ? ?ICD-10-CM  ?1. Right lumbar radiculitis M54.16  ?2. Mechanical low back pain M54.59  ?3. Acute pain of right shoulder M25.511  ? ? ?Prior xray 01-18-2021 ? ? ?FINDINGS:  ?No acute fracture or listhesis. Intervertebral spaces are preserved. Facets are aligned. Sacroiliac joints are approximated. No dilated loop of bowel. ? ? ?Pt states she still has pain in lower back with some pain radiating to her rt leg. Similar to when she saw physical med MD. ? ? ?Recent new onset neck pain for past 6 months. No injury or fall. Pt state pain came on after moving furniture. When turns her neck pain worse. After doing physical work she can't move her neck. Pain  radiates down her rt arm past 6 months.  ? ?Last menstrual cycle  2 weeks ago. ? ? ?Pt has gabapentin but has tried to limit long term use.  ? ? ? ?Review of Systems  ?Constitutional:  Negative for appetite change, chills, diaphoresis, fatigue and fever.  ?Respiratory:  Negative for cough, chest tightness, shortness of breath and wheezing.   ?Cardiovascular:  Negative for chest pain and palpitations.  ?Gastrointestinal:  Negative for abdominal pain, constipation, diarrhea and nausea.  ?Genitourinary:  Negative for difficulty urinating, dysuria, enuresis, flank pain and frequency.  ?Musculoskeletal:  Positive for back pain and neck pain.  ?     Radicular pain.  ?Skin:  Negative for rash.  ? ? ?No past medical history on file. ?  ?Social History  ? ?Socioeconomic History  ? Marital status: Single  ?  Spouse name: Not on file  ? Number of children: Not on file  ? Years of education: Not on file  ? Highest education level: Not on file  ?Occupational History  ? Not on file  ?Tobacco Use  ? Smoking status: Former  ?  Packs/day: 0.50  ?  Years: 10.00  ?  Pack years: 5.00  ?  Types: Cigarettes  ?  Start date: 08/20/2004  ?  Quit date: 08/22/2014  ?  Years since quitting: 7.4  ? Smokeless tobacco: Never  ?Vaping Use  ? Vaping Use: Never used  ?Substance and Sexual Activity  ? Alcohol use:  Yes  ?  Alcohol/week: 1.0 standard drink  ?  Types: 1 Cans of beer per week  ?  Comment: soc  ? Drug use: Never  ? Sexual activity: Not on file  ?Other Topics Concern  ? Not on file  ?Social History Narrative  ? Right handed   ? ?Social Determinants of Health  ? ?Financial Resource Strain: Not on file  ?Food Insecurity: Not on file  ?Transportation Needs: Not on file  ?Physical Activity: Not on file  ?Stress: Not on file  ?Social Connections: Not on file  ?Intimate Partner Violence: Not on file  ? ? ?No past surgical history on file. ? ?Family History  ?Problem Relation Age of Onset  ? Hypertension Mother   ? Hypertension Father   ? Diabetes  Maternal Grandmother   ? Diabetes Maternal Aunt   ? ? ?No Known Allergies ? ?Current Outpatient Medications on File Prior to Visit  ?Medication Sig Dispense Refill  ? amoxicillin-clavulanate (AUGMENTIN) 875-125 MG tablet Take 1 tablet by mouth 2 (two) times daily. 14 tablet 0  ? buPROPion (WELLBUTRIN) 75 MG tablet Take 1 tablet (75 mg total) by mouth 2 (two) times daily. 60 tablet 3  ? cetirizine (ZYRTEC) 10 MG tablet Take 1 tablet (10 mg total) by mouth daily. 30 tablet 3  ? esomeprazole (NEXIUM) 20 MG capsule Take 20 mg by mouth daily at 12 noon. (Patient not taking: Reported on 12/11/2021)    ? fluticasone (FLONASE) 50 MCG/ACT nasal spray 1 spray by Each Nare route daily. (Patient not taking: Reported on 12/11/2021)    ? gabapentin (NEURONTIN) 300 MG capsule Take 1 capsule (300 mg total) by mouth at bedtime. 30 capsule 5  ? meclizine (ANTIVERT) 25 MG tablet TAKE 1 TABLET(25 MG) BY MOUTH THREE TIMES DAILY AS NEEDED FOR DIZZINESS OR NAUSEA (Patient not taking: Reported on 12/11/2021)    ? metFORMIN (GLUCOPHAGE) 500 MG tablet Take 1 tablet (500 mg total) by mouth 2 (two) times daily with a meal. 60 tablet 3  ? naproxen (NAPROSYN) 500 MG tablet Take by mouth. (Patient not taking: Reported on 12/11/2021)    ? ?No current facility-administered medications on file prior to visit.  ? ? ?There were no vitals taken for this visit. ?  ?   ?Objective:  ? Physical Exam ? ? ?General Appearance- Not in acute distress. ? ?Neck- rt side of her neck pain on palpatoin. Turning head to rt and left causes radiating pain to rt upper ext.  ? ?Chest and Lung Exam ?Auscultation: ?Breath sounds:-Normal. Clear even and unlabored. ?Adventitious sounds:- No Adventitious sounds. ? ?Cardiovascular ?Auscultation:Rythm - Regular, rate and rythm. Heart Sounds -Normal heart sounds. ? ?Abdomen ?Inspection:-Inspection Normal.  ?Palpation/Perucssion: Palpation and Percussion of the abdomen reveal- Non Tender, No Rebound tenderness, No rigidity(Guarding)  and No Palpable abdominal masses.  ?Liver:-Normal.  ?Spleen:- Normal.  ? ?Back ?Just to rt of lower portion of lumbar spine mild tenderness to palpation. Tender in rt si area.  ?No pain on straight leg lift. ?No pain on lateral movements and flexion/extension of the spine. ? ?Lower ext neurologic ? ?L5-S1 sensation intact bilaterally.(Though states pain radiates to rt great toe at times) ?Normal patellar reflexes bilaterally. ?No foot drop bilaterally.  ? ?Rt upper ext- normal grip strength but rt hand decreased sharp and dull discrimination on pads. ? ?   ?Assessment & Plan:  ? ?Patient Instructions  ?Right-sided lumbar radicular pain for years.  Specialist notes /physical medicine documented radicular pain at least since  04/19/2021.  At that time pain was present with radiation for at least 6 months.  Patient reporting persisting similar pain.  Will place MRI lumbar spine today. ? ? ?Also reporting recent neck pain with right-sided radicular pain.  We will get cervical spine x-ray first and then decide on possible MRI of cervical spine. ? ?Can continue with salon pas lidocaine patch to rt si area. ? ?Can use gabapentin if needed. Also can use ibuprofen 600-800 every 8 hours if needed. ? ?Can use combination of tylenol and ibuprofen as discussed. ? ?Follow up 3 weeks or sooner if needed  ? ? ?Esperanza Richters, PA-C  ? ? ?Time spent with patient today was 41  minutes which consisted of chart revdiew, discussing diagnosis, work up, treatment and documentation.(Cumalative time first on video and then in person) ?

## 2022-02-06 NOTE — Patient Instructions (Addendum)
Right-sided lumbar radicular pain for years.  Specialist notes /physical medicine documented radicular pain at least since 04/19/2021.  At that time pain was present with radiation for at least 6 months.  Patient reporting persisting similar pain.  Will place MRI lumbar spine today. ? ? ?Also reporting recent neck pain with right-sided radicular pain.  We will get cervical spine x-ray first and then decide on possible MRI of cervical spine. ? ?Can continue with salon pas lidocaine patch to rt si area. ? ?Can use gabapentin if needed. Also can use ibuprofen 600-800 every 8 hours if needed. ? ?Can use combination of tylenol and ibuprofen as discussed. ? ?Follow up 3 weeks or sooner if needed ?

## 2022-02-06 NOTE — Progress Notes (Signed)
Last pap 2021- abnormal ?Pt states she had LEEP procedure in the last year or two with [facility].  Pt did not go back for follow up. ? ?Pt states has irregular heavy, very painful cycles.   ?States she has hx of ovarian cyst.  ?Pt states cramping has been better since taking Metformin.  ? ?Pt has not been on [facility] since 2015, has not been able to get pregnant.  ?Pt has had some abnormal hair growth, ?hormone related.  ? ?Pt has a few skin tags that she would like looked at.  ? ? ? ?

## 2022-02-07 ENCOUNTER — Encounter: Payer: Self-pay | Admitting: Medical

## 2022-02-08 NOTE — Addendum Note (Signed)
Addended by: Gwenevere Abbot on: 02/08/2022 08:44 AM ? ? Modules accepted: Orders ? ?

## 2022-02-11 LAB — FOLLICLE STIMULATING HORMONE: FSH: 25.8 m[IU]/mL

## 2022-02-11 LAB — ESTROGENS, TOTAL: Estrogen: 308 pg/mL

## 2022-02-11 LAB — TESTT+TESTF+SHBG
Sex Hormone Binding: 53.9 nmol/L (ref 24.6–122.0)
Testosterone, Free: 4 pg/mL (ref 0.0–4.2)
Testosterone, Total, LC/MS: 32.5 ng/dL (ref 10.0–55.0)

## 2022-02-13 LAB — CYTOLOGY - PAP
Chlamydia: NEGATIVE
Comment: NEGATIVE
Comment: NEGATIVE
Comment: NORMAL
Diagnosis: NEGATIVE
Diagnosis: REACTIVE
High risk HPV: NEGATIVE
Neisseria Gonorrhea: NEGATIVE

## 2022-02-24 ENCOUNTER — Ambulatory Visit: Payer: Medicaid Other | Admitting: Family Medicine

## 2022-02-24 ENCOUNTER — Ambulatory Visit (HOSPITAL_BASED_OUTPATIENT_CLINIC_OR_DEPARTMENT_OTHER)
Admission: RE | Admit: 2022-02-24 | Discharge: 2022-02-24 | Disposition: A | Discharge status: Home / Self Care | Payer: Medicaid Other | Source: Ambulatory Visit | Attending: Family Medicine | Admitting: Family Medicine

## 2022-02-24 VITALS — BP 120/60 | Ht 66.0 in | Wt 180.0 lb

## 2022-02-24 DIAGNOSIS — M5416 Radiculopathy, lumbar region: Secondary | ICD-10-CM

## 2022-02-24 HISTORY — DX: Radiculopathy, lumbar region: M54.16

## 2022-02-24 IMAGING — DX DG LUMBAR SPINE 2-3V
3 series · 3 of 3 positions shown · non-contrast
Comparison: None.

CLINICAL DATA: Lumbar radiculopathy. Low back pain with right leg
radiculopathy. Lumbar radiculopathy. Low back pain with right leg
pain

EXAM:
LUMBAR SPINE - 2-3 VIEW

[l-spine ap]
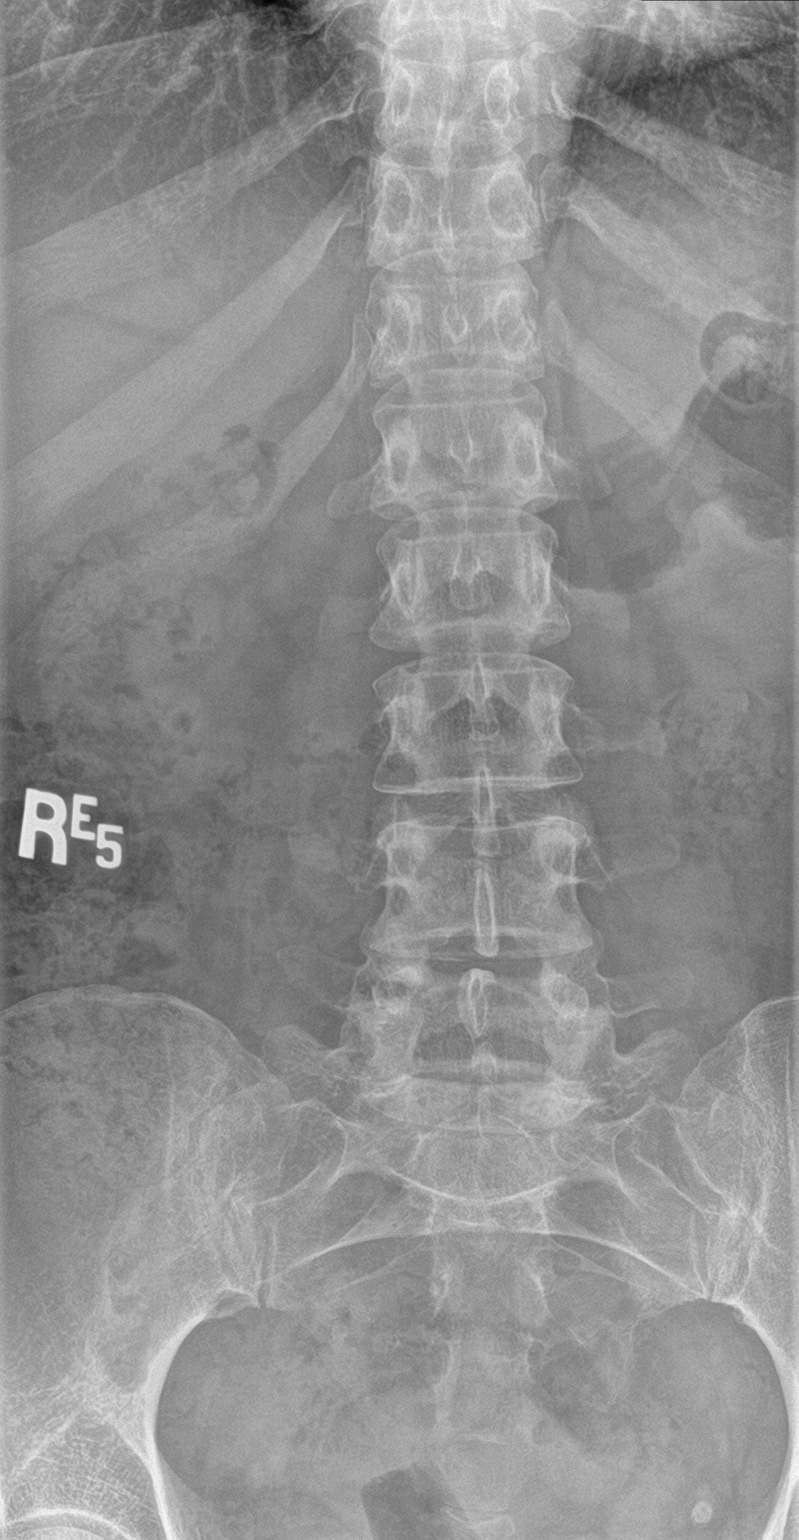

[l-spine lat]
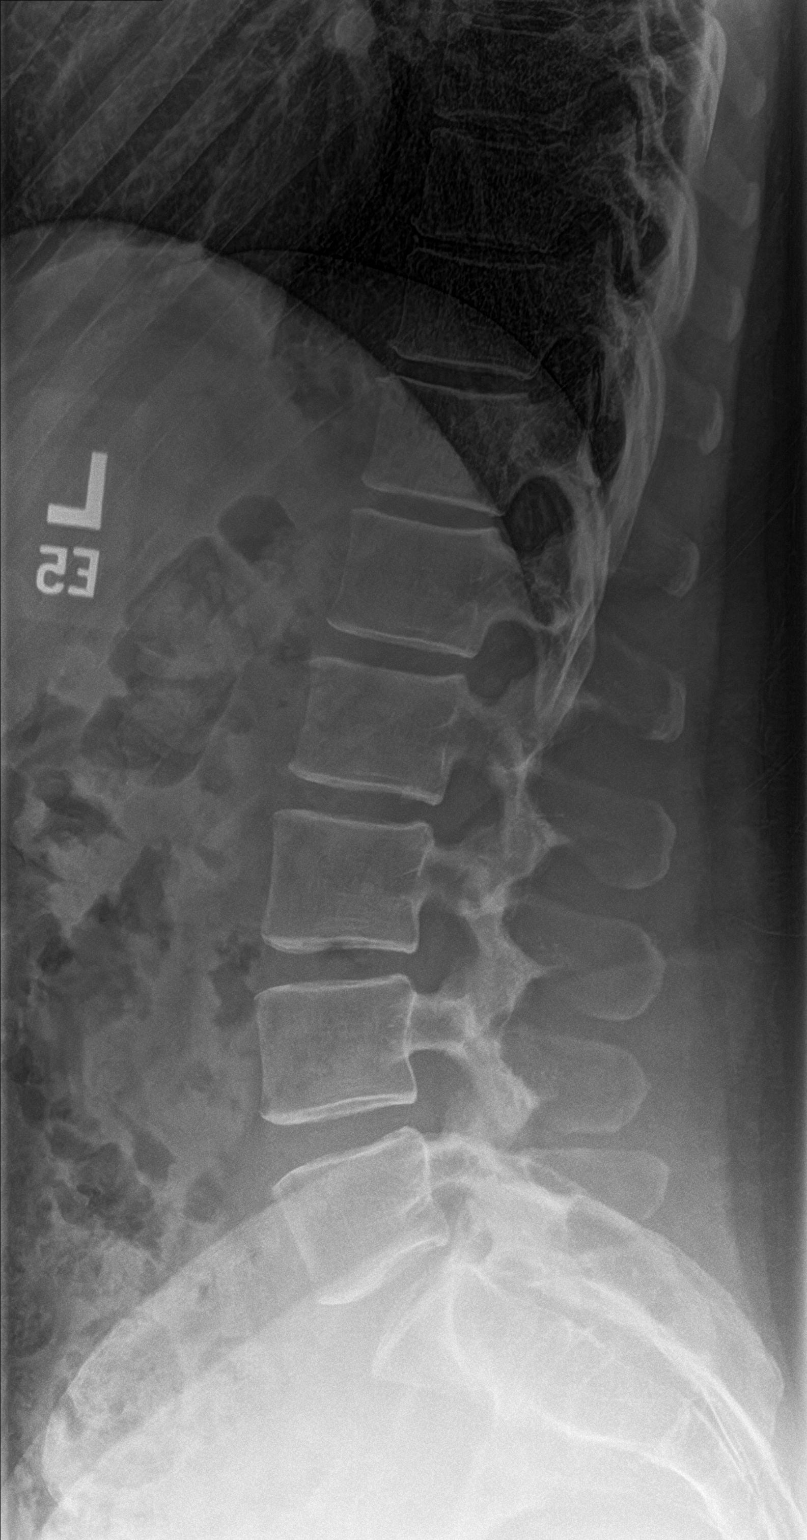

[l-spine spot]
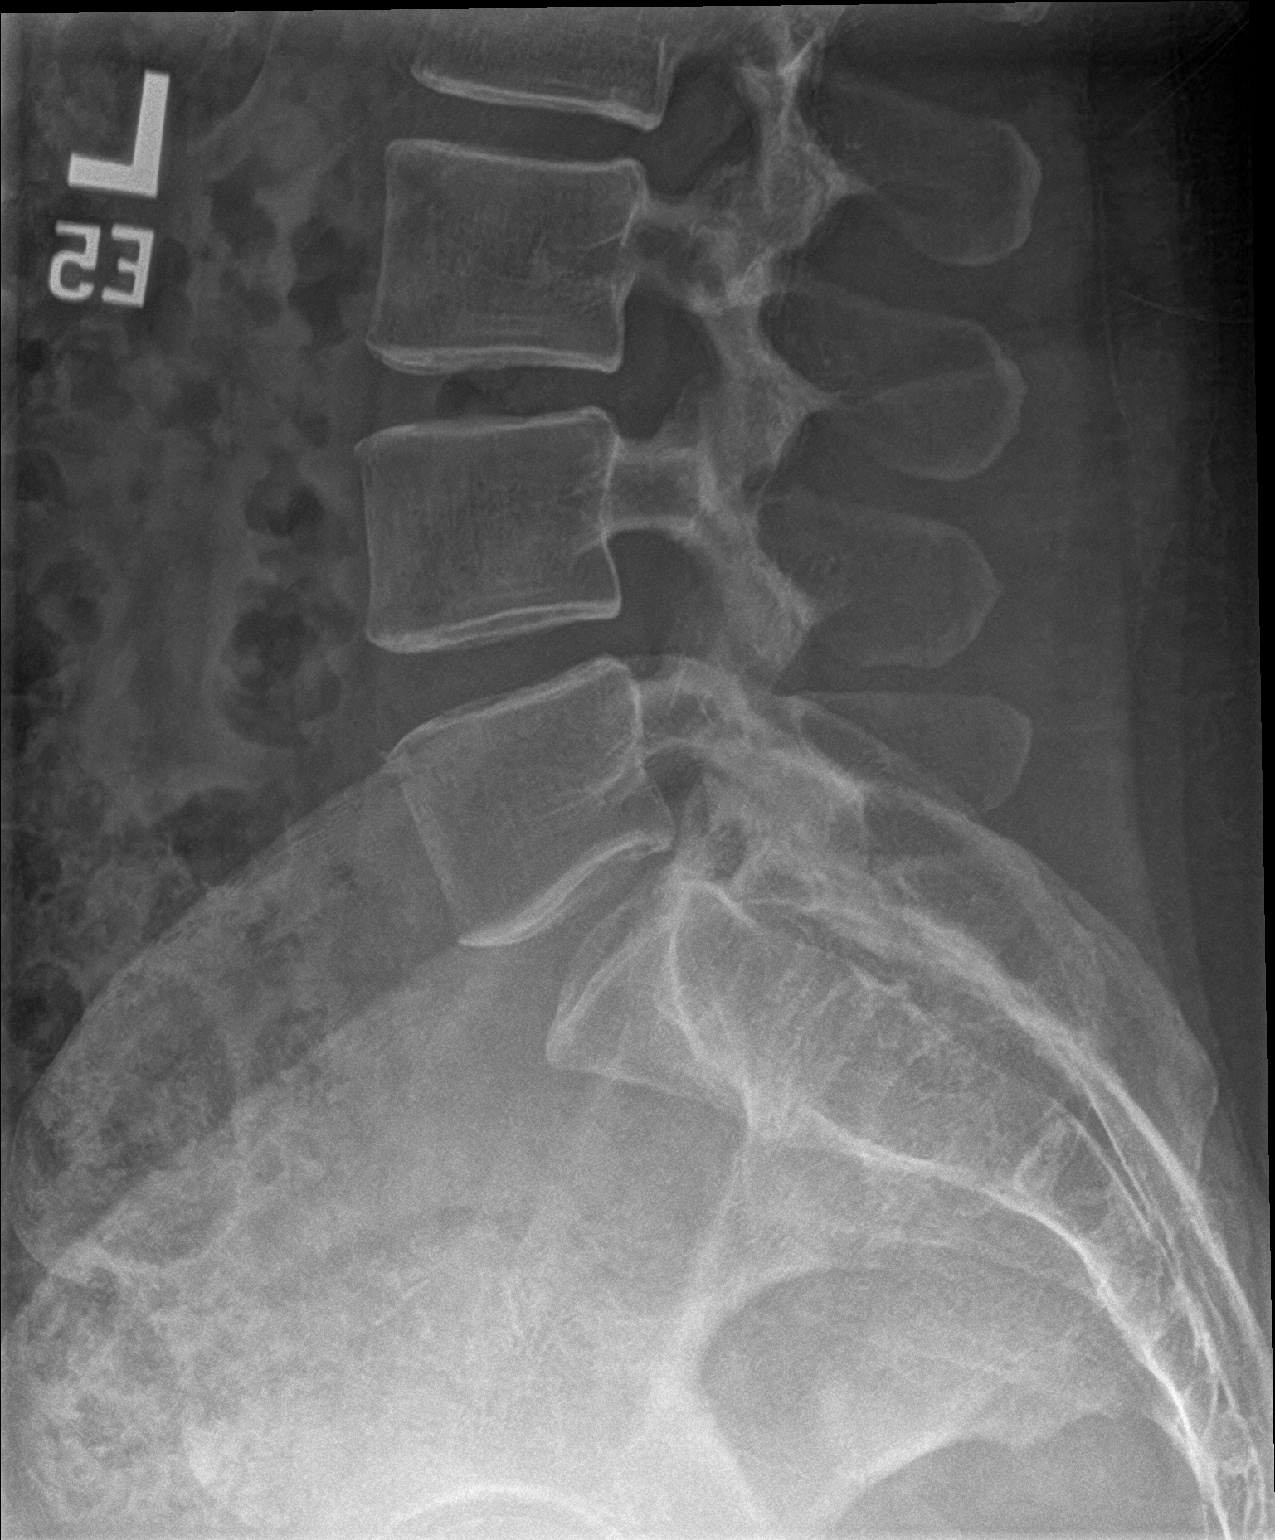

[3 of 3 positions shown; findings below may reference images not displayed]

FINDINGS: There is no evidence of lumbar spine fracture. Alignment is normal.
Intervertebral disc spaces are maintained.
IMPRESSION: Negative.

## 2022-02-24 NOTE — Progress Notes (Signed)
?  Kristen Gibbs - 41 y.o. female MRN [id number]  Date of birth: 01-May-1981 ? ?SUBJECTIVE:  Including CC & ROS.  ?No chief complaint on file. ? ? ?Kristen Gibbs is a 41 y.o. female that is presenting with acute on chronic low back pain and radicular pain down the right leg.  She has had pain for 2 years.  She has had greater than 6 weeks of physician directed home exercise therapy as well as completing physical therapy.  She has tried medications such as gabapentin and anti-inflammatories.  She is having radicular pain down the lateral aspect right leg to the great toe.  Pain is worse with bending over. ? ?Review of the lumbar spine x-ray from 01/18/2021 shows no acute fracture or anterolisthesis. ?Review of the note from 04/19/2021 shows she was planned to have an MRI of the lumbar spine and provided with gabapentin. ? ?Review of Systems ?See HPI  ? ?HISTORY: Past Medical, Surgical, Social, and Family History Reviewed & Updated per EMR.   ?Pertinent Historical Findings include: ? ?No past medical history on file. ? ?No past surgical history on file. ? ? ?PHYSICAL EXAM:  ?VS: BP 120/60   Ht 5\' 6"  (1.676 m)   Wt 180 lb (81.6 kg)   LMP 01/26/2022   BMI 29.05 kg/m?  ?Physical Exam ?Gen: NAD, alert, cooperative with exam, well-appearing ?MSK:  ?Back/Right leg:  ?Normal flexion extension.   ?Weakness with hip flexion. ?Positive straight leg raise. ?Weakness with resistance to plantarflexion dorsiflexion. ?Diminished deep tendon reflexes at the patella when compared to the contralateral side. ?Neurovascularly intact   ? ? ? ? ?ASSESSMENT & PLAN:  ? ?Lumbar radiculopathy ?Acute on chronic in nature.  Her symptoms have been present for years.  She has completed physicaltherapy.  She has been through greater than 6 weeks of physician directed home exercise therapy. ?-Counseled on home exercise therapy and supportive care. ?-X-ray. ?-MRI of the lumbar spine to evaluate for nerve impingement and consideration of  epidural. ?-Referral to physical therapy. ? ? ? ? ?

## 2022-02-24 NOTE — Assessment & Plan Note (Signed)
Acute on chronic in nature.  Her symptoms have been present for years.  She has completed physicaltherapy.  She has been through greater than 6 weeks of physician directed home exercise therapy. ?-Counseled on home exercise therapy and supportive care. ?-X-ray. ?-MRI of the lumbar spine to evaluate for nerve impingement and consideration of epidural. ?-Referral to physical therapy. ?

## 2022-02-24 NOTE — Patient Instructions (Signed)
Nice to meet you  ?Please try heat  ?Please try the exercises  ?I will call with the xray results.  ?We'll get the MRI at [facility].  ?Physical therapy should give you a call.  ?Please send me a message in MyChart with any questions or updates.  ?We'll setup a virtual visit once the MRI is resulted.  ? ?--Dr. Jordan Likes ? ?

## 2022-02-25 ENCOUNTER — Telehealth: Payer: Self-pay | Admitting: Family Medicine

## 2022-02-25 NOTE — Telephone Encounter (Signed)
Informed of results.  ? ?Myra Rude, MD ?[facility] ?02/25/2022, 5:12 PM ? ?

## 2022-02-26 ENCOUNTER — Encounter: Payer: Self-pay | Admitting: Medical

## 2022-02-27 ENCOUNTER — Ambulatory Visit: Payer: Medicaid Other | Admitting: Medical

## 2022-03-01 ENCOUNTER — Ambulatory Visit
Admission: RE | Admit: 2022-03-01 | Discharge: 2022-03-01 | Disposition: A | Discharge status: Home / Self Care | Payer: Medicaid Other | Source: Ambulatory Visit | Attending: Family Medicine | Admitting: Family Medicine

## 2022-03-01 DIAGNOSIS — M5416 Radiculopathy, lumbar region: Secondary | ICD-10-CM

## 2022-03-01 IMAGING — MR MR LUMBAR SPINE W/O CM
4 of 5 series · 27 of 48 positions shown · non-contrast
Comparison: Radiograph from [DATE].

CLINICAL DATA: Evaluation for chronic right lower back pain with
right leg pain, weakness and numbness.

EXAM:
MRI LUMBAR SPINE WITHOUT CONTRAST
TECHNIQUE: Multiplanar, multisequence MR imaging of the lumbar spine was
performed. No intravenous contrast was administered.

[Series 2: T2 · sagittal · 4.0mm · 1.09mm/px · 6 of 17 slices shown (1 of 2)]
[im 1/17]
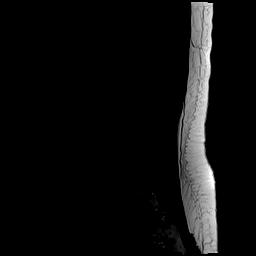
[im 4/17]
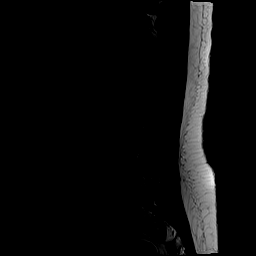
[im 7/17]
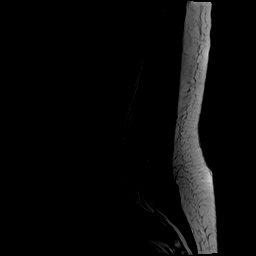
[im 10/17]
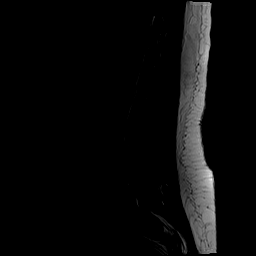
[im 13/17]
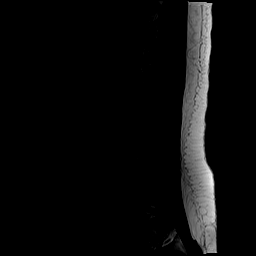
[im 17/17]
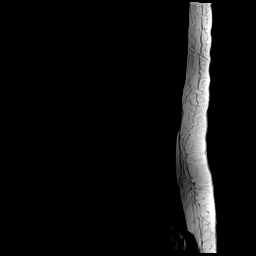

[Series 4: T1 · sagittal · 4.0mm · 1.09mm/px · 6 of 17 slices shown (1 of 2)]
[im 1/17]
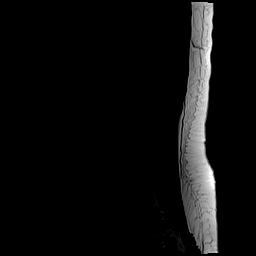
[im 4/17]
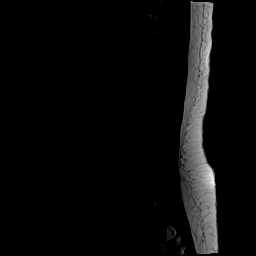
[im 7/17]
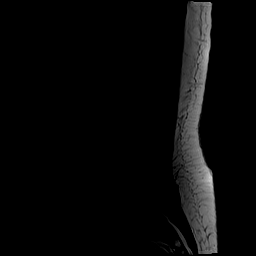
[im 10/17]
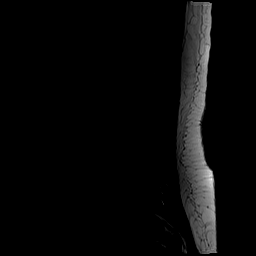
[im 13/17]
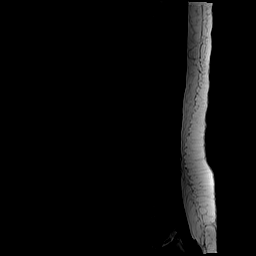
[im 17/17]
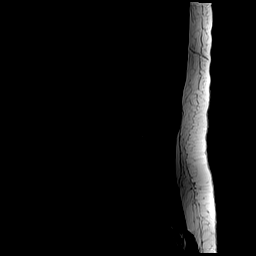

[Series 5: T2 · axial · 4.0mm · 0.39mm/px · z∈[-120,+108]mm · 9 of 41 slices shown (2 of 2)]
[im 1/41]
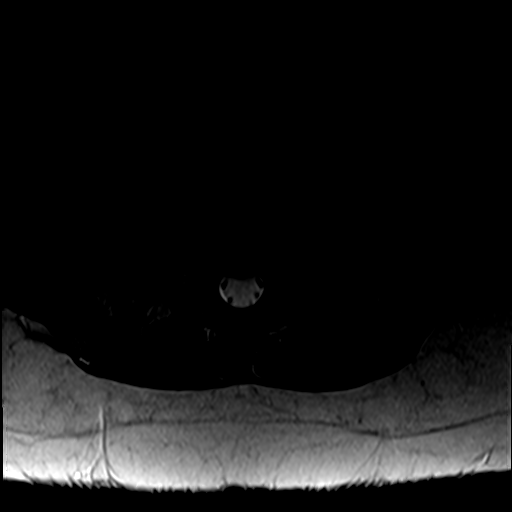
[im 6/41]
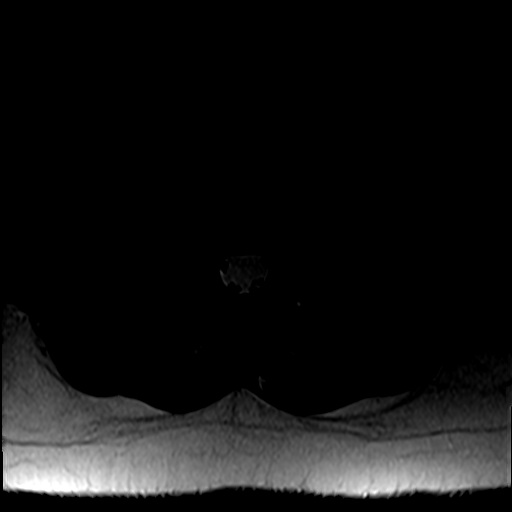
[im 12/41]
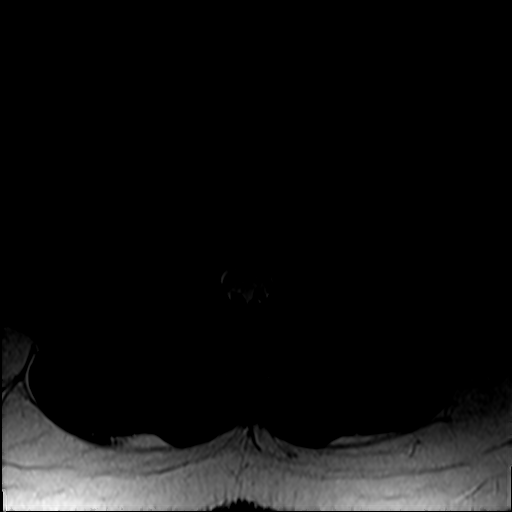
[im 18/41]
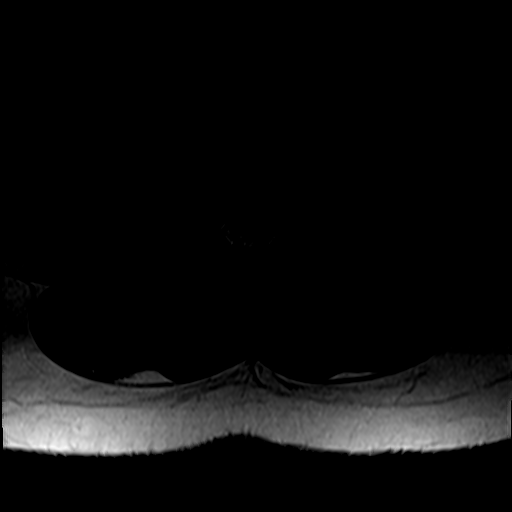
[im 21/41]
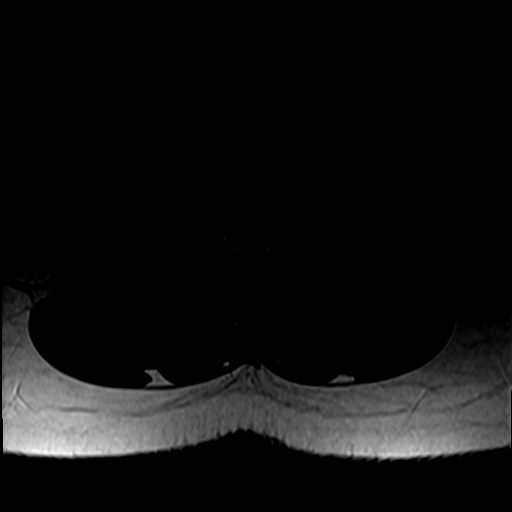
[im 23/41]
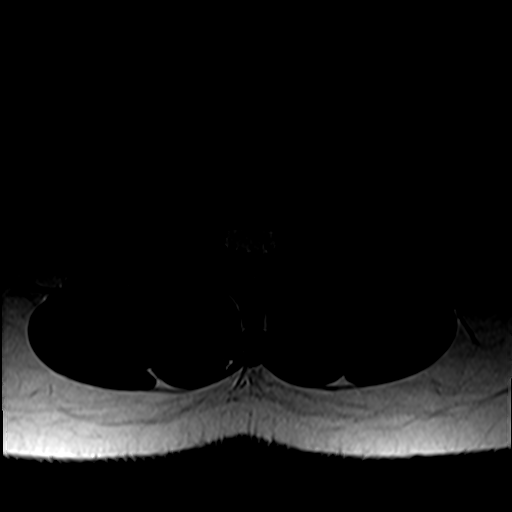
[im 29/41]
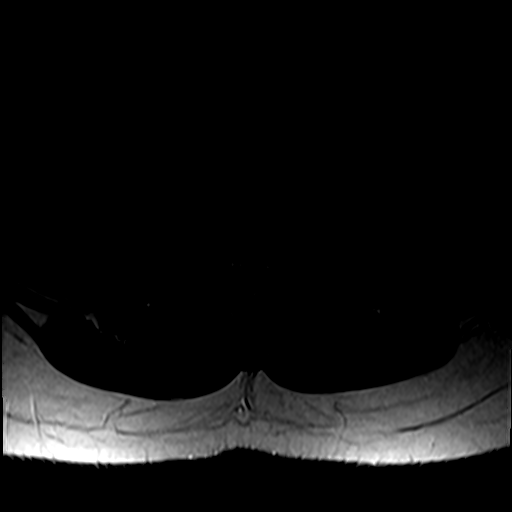
[im 35/41]
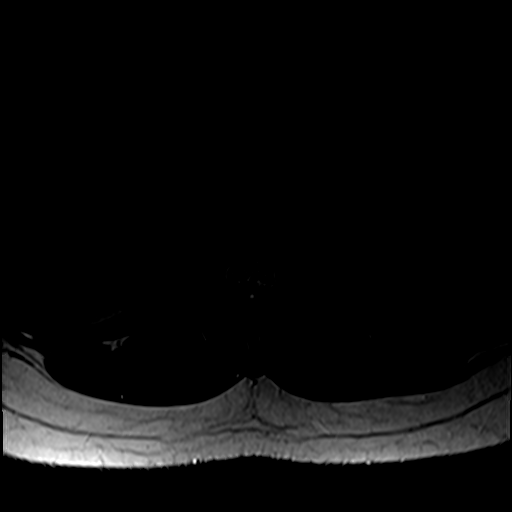
[im 41/41]
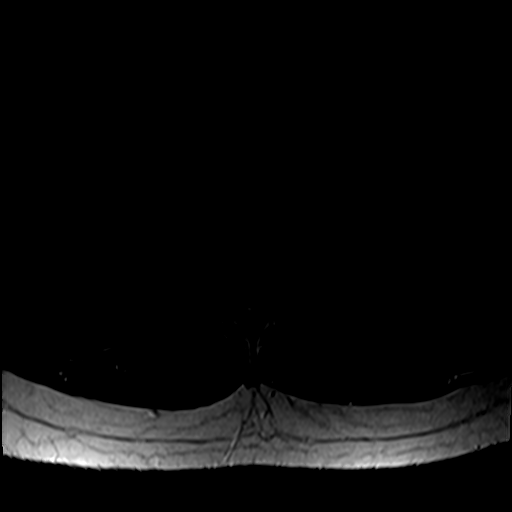

[Series 6: T1 · axial · 4.0mm · 0.39mm/px · z∈[-120,+79]mm · 6 of 41 slices shown (2 of 2)]
[im 1/41]
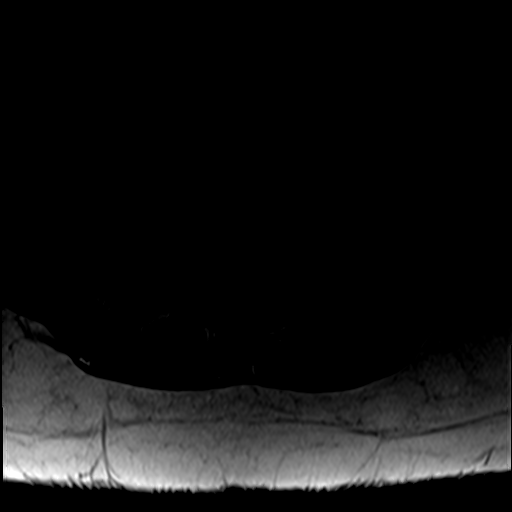
[im 6/41]
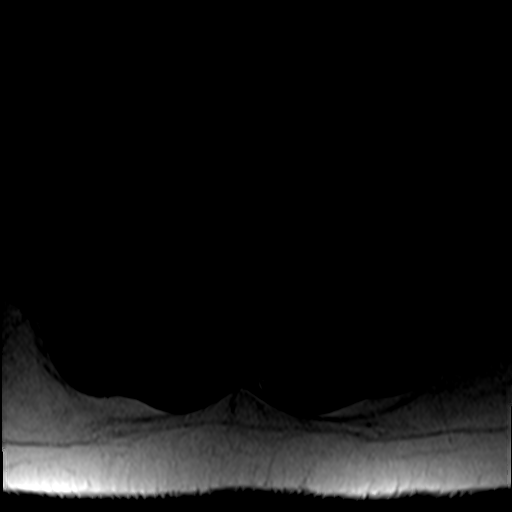
[im 12/41]
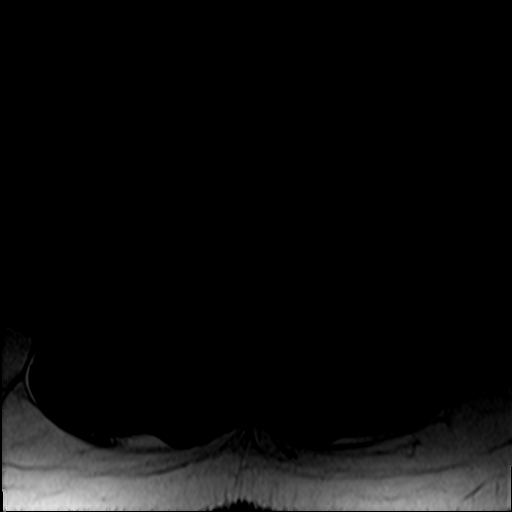
[im 18/41]
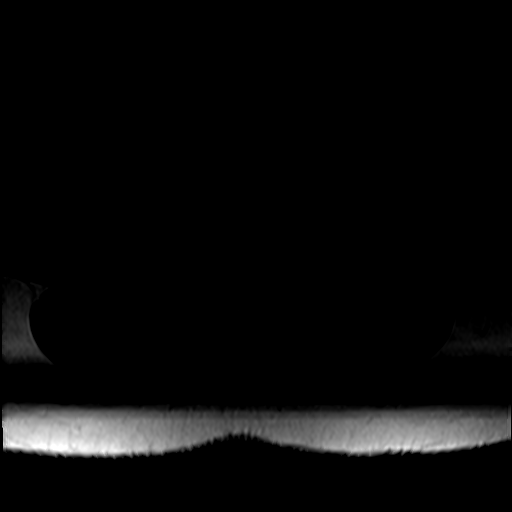
[im 21/41]
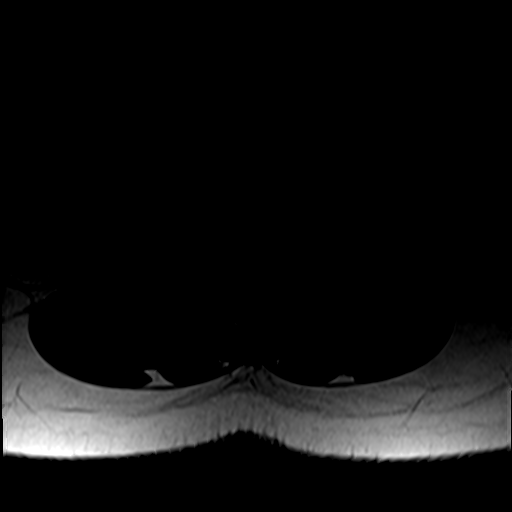
[im 35/41]
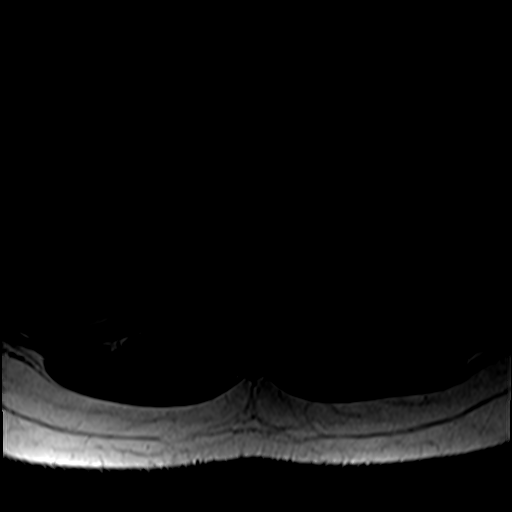

[27 of 48 positions shown; findings below may reference images not displayed]

FINDINGS: Segmentation: Standard. Lowest well-formed disc space labeled the
L5-S1 level.

Alignment: Physiologic with preservation of the normal lumbar
lordosis. No listhesis.

Vertebrae: Vertebral body height maintained without acute or chronic
fracture. Bone marrow signal intensity within normal limits. No
discrete or worrisome osseous lesions or abnormal marrow edema.

Conus medullaris and cauda equina: Conus extends to the L1 level.
Conus and cauda equina appear normal.

Paraspinal and other soft tissues: Unremarkable.

Disc levels:

Lumbar spine is image from the T11-12 level inferiorly. No
significant findings are seen through the L4-5 level.

L5-S1: Disc desiccation. Subtle right foraminal disc protrusion with
annular fissure closely approximates and/or contacts the exiting
right L5 nerve root (series 2, image 4). Additional tiny central
disc protrusion minimally indenting the ventral thecal sac noted as
well. Mild facet spurring. No canal or lateral recess stenosis. Mild
right L5 foraminal narrowing. Left neural foramina remains patent.
IMPRESSION: 1. Subtle right foraminal disc protrusion with annular fissure at
L5-S1, closely approximating and/or contacting the exiting right L5
nerve root.
2. Otherwise essentially normal MRI of the lumbar spine.

## 2022-03-03 ENCOUNTER — Telehealth: Payer: Self-pay | Admitting: Family Medicine

## 2022-03-03 NOTE — Telephone Encounter (Signed)
Cld pt to scheduled Video MRI result appt -- No answer , No VMB unable lv msg --call back later--glh ?

## 2022-03-05 ENCOUNTER — Telehealth: Payer: Self-pay | Admitting: Medical

## 2022-03-05 ENCOUNTER — Encounter: Payer: Self-pay | Admitting: Medical

## 2022-03-05 NOTE — Telephone Encounter (Signed)
Will you get prior authorization for pt ct abd/pevlis. Placed today. Will you get the auth by tomorrow morning? ?

## 2022-03-06 ENCOUNTER — Encounter: Payer: Self-pay | Admitting: Family Medicine

## 2022-03-06 ENCOUNTER — Telehealth (INDEPENDENT_AMBULATORY_CARE_PROVIDER_SITE_OTHER): Payer: Medicaid Other | Admitting: Family Medicine

## 2022-03-06 VITALS — Ht 66.0 in | Wt 180.0 lb

## 2022-03-06 DIAGNOSIS — M5416 Radiculopathy, lumbar region: Secondary | ICD-10-CM | POA: Diagnosis present

## 2022-03-06 MED ORDER — DIAZEPAM 5 MG PO TABS: ORAL_TABLET | ORAL | 0 refills | Status: DC

## 2022-03-06 NOTE — Progress Notes (Signed)
Virtual Visit via Video Note ? ?I connected with [name] on 03/06/22 at  1:30 PM EDT by a video enabled telemedicine application and verified that I am speaking with the correct person using two identifiers. ? ?Location: ?Patient: home ?Provider: office ?  ?I discussed the limitations of evaluation and management by telemedicine and the availability of in person appointments. The patient expressed understanding and agreed to proceed. ? ?History of Present Illness: ? ?Ms. Decoster is a 41 year old female that is following up after the MRI of the lumbar spine.  It was revealing for a subtle right foraminal disc protrusion with annular fissure at L5-S1 ?  ?Observations/Objective: ? ? ?Assessment and Plan: ? ?Right-sided lumbar radiculopathy: ?MRI was revealing for disc retrusion that could be contacting the L5 nerve. ?-Counseled on home exercise therapy and supportive care. ?-Lumbar epidural. ?-Diane for procedure. ? ?Follow Up Instructions: ? ?  ?I discussed the assessment and treatment plan with the patient. The patient was provided an opportunity to ask questions and all were answered. The patient agreed with the plan and demonstrated an understanding of the instructions. ?  ?The patient was advised to call back or seek an in-person evaluation if the symptoms worsen or if the condition fails to improve as anticipated. ? ? ?Clare Gandy, MD ? ? ?

## 2022-03-06 NOTE — Assessment & Plan Note (Signed)
MRI was revealing for disc retrusion that could be contacting the L5 nerve. ?-Counseled on home exercise therapy and supportive care. ?-Lumbar epidural. ?-Diane for procedure. ?

## 2022-03-17 ENCOUNTER — Encounter: Payer: Self-pay | Admitting: Family Medicine

## 2022-03-20 ENCOUNTER — Ambulatory Visit: Payer: Medicaid Other | Attending: Family Medicine | Admitting: Physical Therapy

## 2022-03-20 DIAGNOSIS — M6281 Muscle weakness (generalized): Secondary | ICD-10-CM | POA: Insufficient documentation

## 2022-03-20 DIAGNOSIS — R293 Abnormal posture: Secondary | ICD-10-CM | POA: Insufficient documentation

## 2022-03-20 DIAGNOSIS — M5416 Radiculopathy, lumbar region: Secondary | ICD-10-CM | POA: Insufficient documentation

## 2022-03-20 DIAGNOSIS — M5459 Other low back pain: Secondary | ICD-10-CM | POA: Insufficient documentation

## 2022-03-20 DIAGNOSIS — M62830 Muscle spasm of back: Secondary | ICD-10-CM | POA: Insufficient documentation

## 2022-03-21 ENCOUNTER — Encounter: Payer: Self-pay | Admitting: Family Medicine

## 2022-03-21 ENCOUNTER — Ambulatory Visit
Admission: RE | Admit: 2022-03-21 | Discharge: 2022-03-21 | Disposition: A | Discharge status: Home / Self Care | Payer: Medicaid Other | Source: Ambulatory Visit | Attending: Family Medicine | Admitting: Family Medicine

## 2022-03-21 ENCOUNTER — Other Ambulatory Visit: Payer: Self-pay | Admitting: Family Medicine

## 2022-03-21 DIAGNOSIS — M5416 Radiculopathy, lumbar region: Secondary | ICD-10-CM

## 2022-03-21 IMAGING — XA DG EPIDURAL NERVE ROOT
2 series · 2 of 2 positions shown · non-contrast
Comparison: none

CLINICAL DATA: Right lower extremity back pain and radiculopathy

[Series 1: ortho standard · 1 of 1 slices shown (1 of 2)]
[im 1/1]
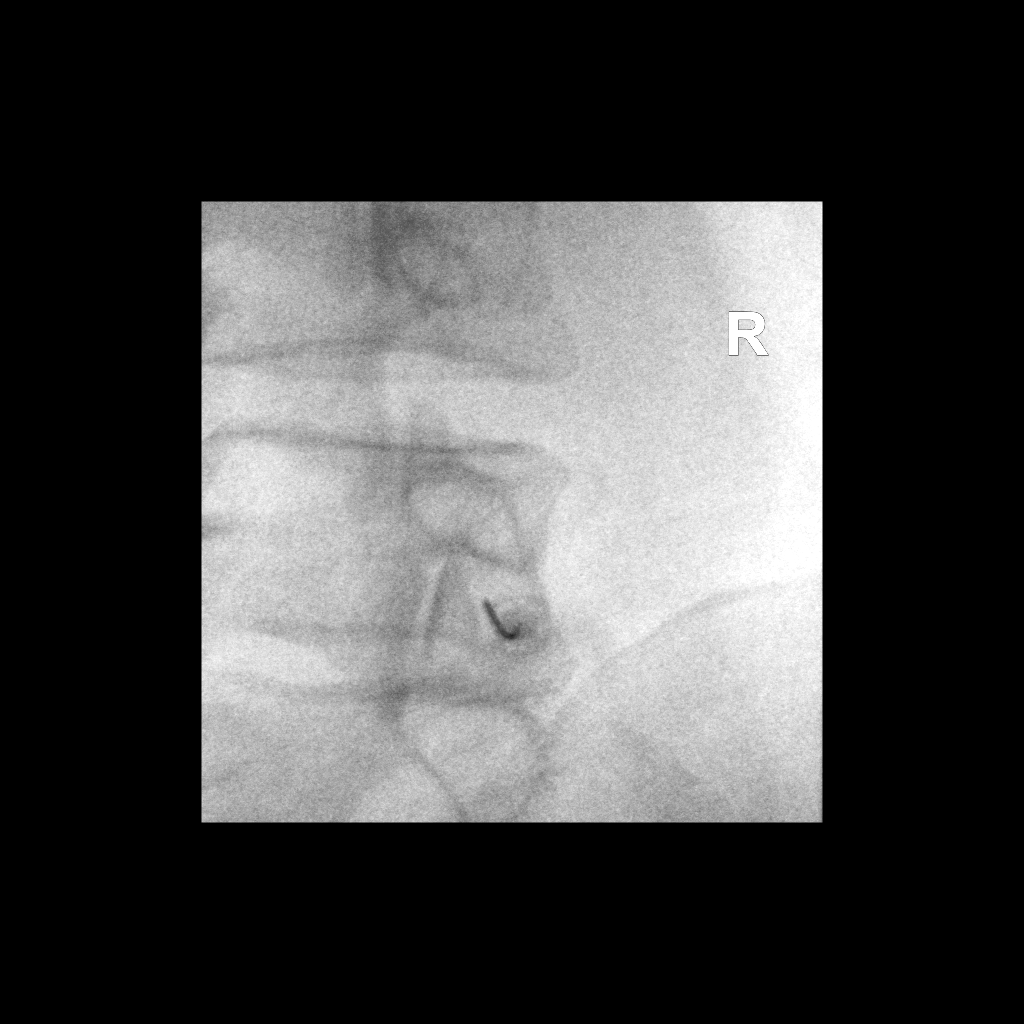

[Series 2: ortho standard · 1 of 1 slices shown (2 of 2)]
[im 1/1]
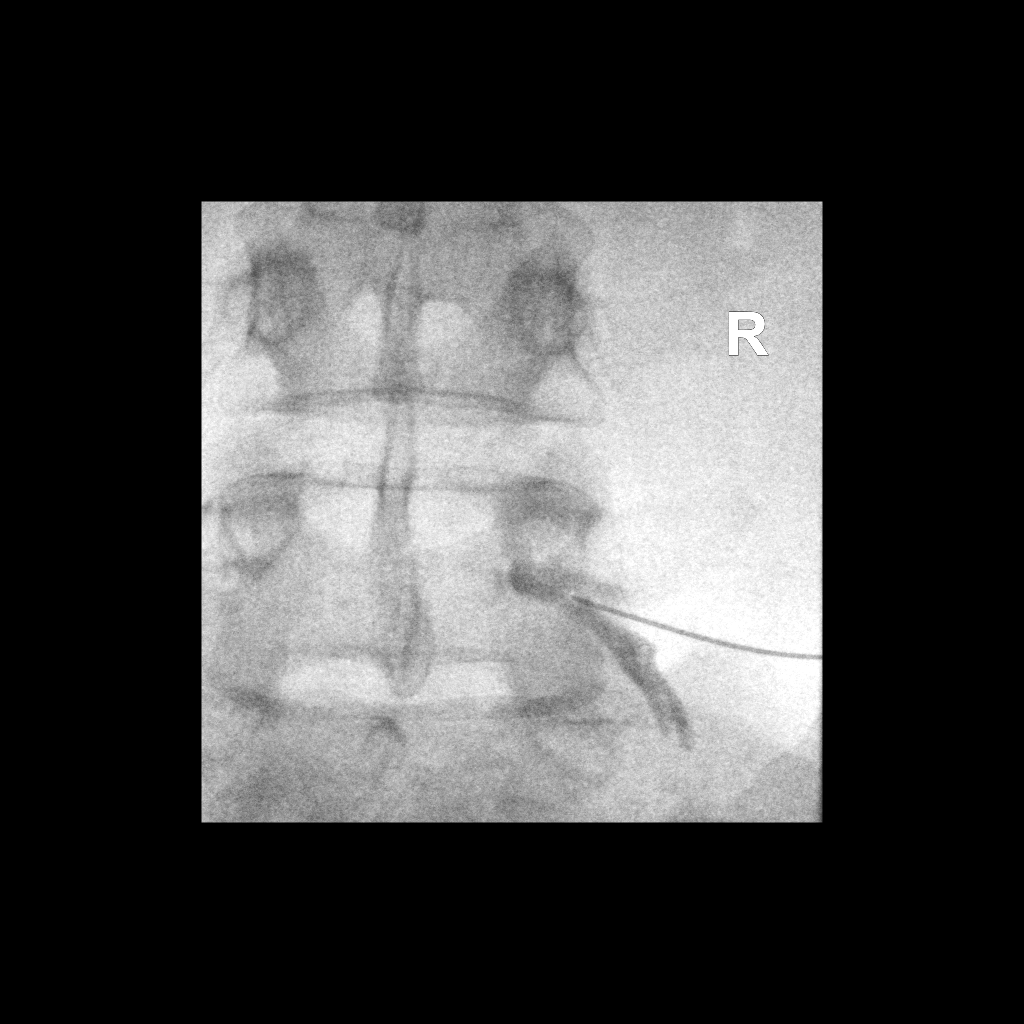

[2 of 2 positions shown; findings below may reference images not displayed]

FLUOROSCOPY:
Radiation Exposure Index (as provided by the fluoroscopic device):
0.8 minutes (7 mGy)

PROCEDURE:
The procedure, risks, benefits, and alternatives were explained to
the patient. Questions regarding the procedure were encouraged and
answered. The patient understands and consents to the procedure.

LUMBAR EPIDURAL INJECTION:

A transforaminal approach was performed on right at L5-S1. The
overlying skin was cleansed and anesthetized. A 20 gauge epidural
needle was advanced using loss-of-resistance technique.

DIAGNOSTIC EPIDURAL INJECTION:

Injection of Isovue-M 200 shows a good transforaminal epidural
pattern along the right L5-S1 nerve root. No vascular opacification
is seen.

THERAPEUTIC EPIDURAL INJECTION:

80 Mg of Depo-Medrol mixed with 1 mL 1% lidocaine were instilled.
The procedure was well-tolerated, and the patient was discharged
thirty minutes following the injection in good condition.

COMPLICATIONS:
None immediate
IMPRESSION: Technically successful epidural steroid injection/nerve root block
of the right L5-S1 nerve root via a transforaminal approach.

## 2022-03-21 MED ORDER — DIAZEPAM 5 MG PO TABS
5.0000 mg | ORAL_TABLET | Freq: Once | ORAL | Status: AC
  Administered 2022-03-21: 5 mg via ORAL

## 2022-03-21 MED ORDER — IOPAMIDOL (ISOVUE-M 200) INJECTION 41%
1.0000 mL | Freq: Once | INTRAMUSCULAR | Status: AC
  Administered 2022-03-21: 1 mL via EPIDURAL

## 2022-03-21 MED ORDER — METHYLPREDNISOLONE ACETATE 40 MG/ML INJ SUSP (RADIOLOG
80.0000 mg | Freq: Once | INTRAMUSCULAR | Status: AC
  Administered 2022-03-21: 80 mg via EPIDURAL

## 2022-03-21 NOTE — Discharge Instructions (Signed)

## 2022-03-24 ENCOUNTER — Ambulatory Visit: Payer: Medicaid Other | Admitting: Physical Therapy

## 2022-03-24 NOTE — Therapy (Incomplete)
?OUTPATIENT PHYSICAL THERAPY THORACOLUMBAR EVALUATION ? ? ?Patient Name: Kristen Gibbs ?MRN: [id number] ?DOB:December 11, 1980, 41 y.o., female ?Today's Date: 03/24/2022 ? ? ? ?No past medical history on file. ?No past surgical history on file. ?Patient Active Problem List  ? Diagnosis Date Noted  ? Lumbar radiculopathy 02/24/2022  ? ? ?PCP: Mackie Pai, PA-C ? ?REFERRING PROVIDER: Clearance Coots, MD ? ?REFERRING DIAG: M54.16 (ICD-10-CM) - Lumbar radiculopathy  ? ?THERAPY DIAG:  ?No diagnosis found. ? ?ONSET DATE: *** ? ?SUBJECTIVE:                                                                                                                                                                                          ? ?SUBJECTIVE STATEMENT: ?*** ?PERTINENT HISTORY:  ?*** ? ?PAIN:  ?Are you having pain? Yes: {yespain:27235::"NPRS scale: ***/10","Pain location: ***","Pain description: ***","Aggravating factors: ***","Relieving factors: ***"} ? ? ?PRECAUTIONS: {Therapy precautions:24002} ? ?WEIGHT BEARING RESTRICTIONS {Yes ***/No:24003} ? ?FALLS:  ?Has patient fallen in last 6 months? {fallsyesno:27318} ? ?LIVING ENVIRONMENT: ?Lives with: {[facility] lives with:25569::"lives with their family"} ?Lives in: {Lives in:25570} ?Stairs: {opstairs:27293} ?Has following equipment at home: {Assistive devices:23999} ? ?OCCUPATION: *** ? ?PLOF: {PLOF:24004} ? ?PATIENT GOALS *** ? ? ?OBJECTIVE:  ? ?DIAGNOSTIC FINDINGS:  ?*** ? ?PATIENT SURVEYS:  ?{rehab surveys:24030} ? ?SCREENING FOR RED FLAGS: ?Bowel or bladder incontinence: {Yes/No:304960894} ?Spinal tumors: {Yes/No:304960894} ?Cauda equina syndrome: {Yes/No:304960894} ?Compression fracture: {Yes/No:304960894} ?Abdominal aneurysm: {Yes/No:304960894} ? ?COGNITION: ? Overall cognitive status: {cognition:24006}   ?  ?SENSATION: ?{sensation:27233} ? ?MUSCLE LENGTH: ?Hamstrings: Right *** deg; Left *** deg ?Thomas test: Right *** deg; Left *** deg ? ?POSTURE:  ?*** ? ?PALPATION: ?*** ? ?LUMBAR  ROM:  ? ?{AROM/PROM:27142}  A/PROM  ?03/24/2022  ?Flexion   ?Extension   ?Right lateral flexion   ?Left lateral flexion   ?Right rotation   ?Left rotation   ? (Blank rows = not tested) ? ?LE ROM: ? ?{AROM/PROM:27142}  Right ?03/24/2022 Left ?03/24/2022  ?Hip flexion    ?Hip extension    ?Hip abduction    ?Hip adduction    ?Hip internal rotation    ?Hip external rotation    ?Knee flexion    ?Knee extension    ?Ankle dorsiflexion    ?Ankle plantarflexion    ?Ankle inversion    ?Ankle eversion    ? (Blank rows = not tested) ? ?LE MMT: ? ?MMT Right ?03/24/2022 Left ?03/24/2022  ?Hip flexion    ?Hip extension    ?Hip abduction    ?Hip adduction    ?Hip internal rotation    ?Hip external rotation    ?Knee flexion    ?Knee extension    ?  Ankle dorsiflexion    ?Ankle plantarflexion    ?Ankle inversion    ?Ankle eversion    ? (Blank rows = not tested) ? ?LUMBAR SPECIAL TESTS:  ?{lumbar special test:25242} ? ?FUNCTIONAL TESTS:  ?{Functional tests:24029} ? ?GAIT: ?Distance walked: *** ?Assistive device utilized: {Assistive devices:23999} ?Level of assistance: {Levels of assistance:24026} ?Comments: *** ? ? ? ?TODAY'S TREATMENT  ?*** ? ? ?PATIENT EDUCATION:  ?Education details: *** ?Person educated: {Person educated:25204} ?Education method: {Education Method:25205} ?Education comprehension: {Education Comprehension:25206} ? ? ?HOME EXERCISE PROGRAM: ?*** ? ?ASSESSMENT: ? ?CLINICAL IMPRESSION: ?Patient is a *** y.o. *** who was seen today for physical therapy evaluation and treatment for ***.  ? ? ?OBJECTIVE IMPAIRMENTS {opptimpairments:25111}.  ? ?ACTIVITY LIMITATIONS {activity limitations:25113}.  ? ?PERSONAL FACTORS {Personal factors:25162} are also affecting patient's functional outcome.  ? ? ?REHAB POTENTIAL: {rehabpotential:25112} ? ?CLINICAL DECISION MAKING: {clinical decision making:25114} ? ?EVALUATION COMPLEXITY: {Evaluation complexity:25115} ? ? ?GOALS: ?Goals reviewed with patient? {yes/no:20286} ? ?SHORT TERM GOALS: Target  date: {follow up:25551} ? ?*** ?Baseline: ?Goal status: {GOALSTATUS:25110} ? ?2.  *** ?Baseline:  ?Goal status: {GOALSTATUS:25110} ? ?3.  *** ?Baseline:  ?Goal status: {GOALSTATUS:25110} ? ?4.  *** ?Baseline:  ?Goal status: {GOALSTATUS:25110} ? ?5.  *** ?Baseline:  ?Goal status: {GOALSTATUS:25110} ? ?6.  *** ?Baseline:  ?Goal status: {GOALSTATUS:25110} ? ?LONG TERM GOALS: Target date: {follow up:25551} ? ?*** ?Baseline:  ?Goal status: {GOALSTATUS:25110} ? ?2.  *** ?Baseline:  ?Goal status: {GOALSTATUS:25110} ? ?3.  *** ?Baseline:  ?Goal status: {GOALSTATUS:25110} ? ?4.  *** ?Baseline:  ?Goal status: {GOALSTATUS:25110} ? ?5.  *** ?Baseline:  ?Goal status: {GOALSTATUS:25110} ? ?6.  *** ?Baseline:  ?Goal status: {GOALSTATUS:25110} ? ? ?PLAN: ?PT FREQUENCY: {rehab frequency:25116} ? ?PT DURATION: {rehab duration:25117} ? ?PLANNED INTERVENTIONS: {rehab planned interventions:25118::"Therapeutic exercises","Therapeutic activity","Neuromuscular re-education","Balance training","Gait training","Patient/Family education","Joint mobilization"}. ? ?PLAN FOR NEXT SESSION: *** ? ? ?Percival Spanish, PT ?03/24/2022, 8:23 AM  ?

## 2022-03-27 ENCOUNTER — Encounter: Payer: Medicaid Other | Admitting: Physical Therapy

## 2022-03-31 ENCOUNTER — Ambulatory Visit: Payer: Medicaid Other | Admitting: Family Medicine

## 2022-03-31 ENCOUNTER — Other Ambulatory Visit: Payer: Self-pay

## 2022-03-31 ENCOUNTER — Ambulatory Visit: Payer: Medicaid Other | Admitting: Physical Therapy

## 2022-03-31 ENCOUNTER — Encounter: Payer: Self-pay | Admitting: Physical Therapy

## 2022-03-31 ENCOUNTER — Encounter: Payer: Self-pay | Admitting: Family Medicine

## 2022-03-31 VITALS — BP 116/68 | Ht 66.0 in | Wt 180.0 lb

## 2022-03-31 DIAGNOSIS — M5416 Radiculopathy, lumbar region: Secondary | ICD-10-CM

## 2022-03-31 DIAGNOSIS — S161XXA Strain of muscle, fascia and tendon at neck level, initial encounter: Secondary | ICD-10-CM | POA: Diagnosis not present

## 2022-03-31 DIAGNOSIS — M62830 Muscle spasm of back: Secondary | ICD-10-CM | POA: Diagnosis present

## 2022-03-31 DIAGNOSIS — M6281 Muscle weakness (generalized): Secondary | ICD-10-CM | POA: Diagnosis present

## 2022-03-31 DIAGNOSIS — R293 Abnormal posture: Secondary | ICD-10-CM

## 2022-03-31 DIAGNOSIS — M5459 Other low back pain: Secondary | ICD-10-CM | POA: Diagnosis present

## 2022-03-31 DIAGNOSIS — M5412 Radiculopathy, cervical region: Secondary | ICD-10-CM | POA: Insufficient documentation

## 2022-03-31 HISTORY — DX: Radiculopathy, cervical region: M54.12

## 2022-03-31 NOTE — Assessment & Plan Note (Signed)
Acutely worsening.  Did get relief with recent epidural but the pain has returned and is severe in nature.  Having urinary incontinence that seems more related to urge or stress incontinence as opposed to cauda equina. ?-Counseled on home exercise therapy and supportive care. ?-Referral to neurosurgery ?

## 2022-03-31 NOTE — Patient Instructions (Signed)
Good to see you ?I have made a referral to neurosurgery   ?Please send me a message in MyChart with any questions or updates.  ?Please see me back to start shockwave therapy.  ? ?--Dr. Jordan Likes ? ?

## 2022-03-31 NOTE — Therapy (Addendum)
OUTPATIENT PHYSICAL THERAPY THORACOLUMBAR EVALUATION   Patient Name: Kristen Gibbs MRN: [id number] DOB:1981/10/17, 41 y.o., female Today's Date: 03/31/2022   PT End of Session - 03/31/22 1317     Visit Number 1    Number of Visits 13    Date for PT Re-Evaluation 05/12/22    Authorization Type [employer]    PT Start Time 1317    PT Stop Time 1412    PT Time Calculation (min) 55 min    Activity Tolerance Patient tolerated treatment well;Patient limited by pain    Behavior During Therapy [facility] for tasks assessed/performed             History reviewed. No pertinent past medical history. History reviewed. No pertinent surgical history. Patient Active Problem List   Diagnosis Date Noted   Lumbar radiculopathy 02/24/2022    PCP: Marisue Brooklyn   REFERRING PROVIDER: Myra Rude, MD  REFERRING DIAG: [phone] (ICD-10-CM) - Lumbar radiculopathy  THERAPY DIAG:  Radiculopathy, lumbar region  Other low back pain  Muscle spasm of back  Muscle weakness (generalized)  Abnormal posture  ONSET DATE: chronic  SUBJECTIVE:                                                                                                                                                                                           SUBJECTIVE STATEMENT: Kristen Gibbs reports long h/o neck and back issues. Feels in lower R back and into buttock. She received an injection in her low back on 03/21/22 with some relief but still gets very sore/painful with normal everyday activities. Any strenuous work will result in increase in pain and she will feel her pain that night or next day. Since the injection she has felt constant pain/pressure in her butt with pain down her legs and new onset of stress incontinence - has not told the MD but she will see MD later today. Back pain started a few years ago related to heavy lifting with pain becoming more constant recently. Constant radicular pain and  intermittent numbness extend down back of her R leg into her toes. She has seen a chiropractor for her neck and back but has only completed 1-2 PT sessions while she lived in [place]. She tries to do exercises including sit-ups and push-ups as well as piliates exercises. When her low back is not hurting, her neck usually is.   PERTINENT HISTORY:   Recurrent major depressive disorder, in partial remission (CMS-HCC)   Endometriosis   Non-seasonal allergic rhinitis due to pollen   Cervical dysplasia   Status post LEEP (loop electrosurgical excision procedure) of cervix  Irritable bowel syndrome with diarrhea   Gastroesophageal reflux disease without esophagitis   Neck pain   Mechanical low back pain   Low libido   Acute non-recurrent sinusitis   PAIN:  Are you having pain? Yes: NPRS scale: 3/10 Pain location: R low back, extending up back and down R LE Pain description: dull ache Aggravating factors: worse with bending; prolonged sitting, standing or walking; lifting Relieving factors: walk around; stretching; alternating positions; OTC meds; lidocaine patches   PRECAUTIONS: None  WEIGHT BEARING RESTRICTIONS No  FALLS:  Has patient fallen in last 6 months? No  LIVING ENVIRONMENT: Lives with: lives with their family and lives with their spouse Lives in: House/apartment Stairs: Yes: Internal: 15-20 steps; on left going up and External: 2 steps; none Has following equipment at home: None   OCCUPATION: unemployed  PLOF: Independent and Leisure: anything outdoors - gardening, hiking, swimming, playing ball with 4 dogs, walking with her son  PATIENT GOALS "Maybe learn some stretches or something that will help so I can maybe not be in pain."   OBJECTIVE:   DIAGNOSTIC FINDINGS:  Lumbar MRI 03/01/22: 1. Subtle right foraminal disc protrusion with annular fissure at L5-S1, closely approximating and/or contacting the exiting right L5 nerve root.  2. Otherwise essentially normal  MRI of the lumbar spine.  PATIENT SURVEYS:  Modified Oswestry = 29, 58% disability  FOTO Lumbar = 42, predicted = 52  SCREENING FOR RED FLAGS: Bowel or bladder incontinence: Yes: stress incontinence over the past week Spinal tumors: No Cauda equina syndrome: No Compression fracture: No Abdominal aneurysm: No  COGNITION:  Overall cognitive status: Within functional limits for tasks assessed     SENSATION: Hot/Cold: Impaired  and delayed processing of hot Intermittent numbness in toes and bottom of feet  MUSCLE LENGTH: Hamstrings: mod tight B Piriformis: mild/mod tight L>R Quad/hip flexors: mod tight R>L ITB: mod tight, mild tight L  POSTURE:  Lumbar spine WNL in standing but forward head and rounded shoulders  PALPATION: (+) TTP over R piriformis > glutes; (-) TTP in lumbar paraspinals  LUMBAR ROM:   Active  A/PROM  03/31/2022  Flexion Hands to ankles - increased pain  Extension Mildly limited - decreases LBP/buttock pain but increases neck pain  Right lateral flexion Fingertips to lateral joint line - tight  Left lateral flexion Fingertips to lateral joint line - tight  Right rotation 50% limited - pain in R buttock  Left rotation 40% limited   (Blank rows = not tested)  LE MMT:  MMT Right 03/31/2022 Left 03/31/2022  Hip flexion 4 4+  Hip extension 4 4+  Hip abduction 4 4  Hip adduction 4 * 4+  Hip internal rotation 4 * 4+  Hip external rotation 4 * 4+  Knee flexion 4 4+  Knee extension 4+ 5  Ankle dorsiflexion 4- 4  Ankle plantarflexion    Ankle inversion    Ankle eversion     (Blank rows = not tested)  LUMBAR SPECIAL TESTS:  Straight leg raise test: Negative and Slump test: Positive   TODAY'S TREATMENT  Brief review of current self-created HEP - pt encouraged to focus on stretching with neutral spine and extension exercises avoiding sit-ups    PATIENT EDUCATION:  Education details: PT eval findings, anticipated POC, and postural awareness Person  educated: Patient Education method: Explanation and Verbal cues Education comprehension: verbalized understanding, verbal cues required, and needs further education   HOME EXERCISE PROGRAM: TBD  ASSESSMENT:  CLINICAL IMPRESSION: Kristen Gibbs is  a 41 y.o. female who was seen today for physical therapy evaluation and treatment for R-sided LBP with R LE radiculopathy. Pain has been chronic over the past few years with onset related to repetitive heavy lifting. Deficits include chronic, constant right lower back pain, right LE radiculopathy, mild to moderately decreased proximal LE and lumbopelvic flexibility, tenderness to R piriformis, (+) slump special test on RLE, as well as core/lumbopelvic and right LE weakness. Objective measures are consistent with chronic low back pain with radicular R sided symptoms with pt demonstrating peripheralization of symptoms with lumbar flexion and centralization of symptoms with WBing and NWBing lumbar extension. Pain is limiting her ability to perform ADLs, household chores, yardwork, and exercise.Blima RichStephani will benefit from skilled PT to address above deficits, improve flexibility and core/LE strength to improve positional and activity tolerance w/o pain interference.  OBJECTIVE IMPAIRMENTS decreased activity tolerance, decreased knowledge of condition, decreased mobility, difficulty walking, decreased ROM, decreased strength, increased fascial restrictions, impaired perceived functional ability, increased muscle spasms, impaired flexibility, impaired sensation, improper body mechanics, postural dysfunction, and pain.   ACTIVITY LIMITATIONS cleaning, community activity, driving, meal prep, laundry, yard work, and shopping.   PERSONAL FACTORS Past/current experiences, Time since onset of injury/illness/exacerbation, and 3+ comorbidities: anxiety and depression, endometriosis, cervical dysplasia, IBS with diarrhea, GERD, neck pain, and allergies are also affecting  patient's functional outcome.    REHAB POTENTIAL: Good  CLINICAL DECISION MAKING: Evolving/moderate complexity  EVALUATION COMPLEXITY: Moderate   GOALS: Goals reviewed with patient? Yes  SHORT TERM GOALS: Target date: 04/21/2022  Patient will be independent with initial HEP.  Baseline: Blima RichStephani has been performing self-created exercise program but no formal HEP. Goal status: INITIAL  2.  Patient will report centralization of radicular symptoms.  Baseline: Kelby reports near constant R LE radicular pain with intermittent numbness to R toes. Goal status: INITIAL  3.  Patient will verbalize/demonstrate understanding of neutral spine posture and proper body mechanics to reduce strain on lumbar spine Baseline: Lumbar spine WNL in standing but forward head and rounded shoulders Goal status: INITIAL   LONG TERM GOALS: Target date: 05/12/2022  Patient will be independent with advanced/ongoing HEP to improve outcomes and carryover.  Baseline: HEP TBD Goal status: INITIAL  2.  Patient to report reduction in frequency and intensity of LBP by >/= 50-75% to allow for improved activity tolerance Baseline: Near constant LBP at >/= 3/10 Goal status: INITIAL  3.  Patient will demonstrate full pain free lumbar ROM to perform ADLs.  Baseline: Limited lumbar ROM with pain in flexion and R rotation Goal status: INITIAL  4.  Patient will report 4552 on lumbar FOTO to demonstrate improved functional ability.  Baseline: 42 Goal status: INITIAL  5.  Patient will demonstrate improved B LE strength to >/= 4+/5 for improved stability and ease of mobility  Baseline: Refer to above MMT chart Goal status: INITIAL  6.  Improve understanding of body mechanics and spine care with patient to verbalize and demo correct lifting techniques Baseline:  Goal status: INITIAL   7. Patient to report ability to perform ADLs, household, and leisure activities without limitation due to LBP or R LE radicular  pain, LOM or weakness Baseline: Pain is limiting her ability to perform ADLs, household chores, yardwork, and exercise Goal status: INITIAL   PLAN: PT FREQUENCY: 2x/week  PT DURATION: 6 weeks  PLANNED INTERVENTIONS: Therapeutic exercises, Therapeutic activity, Neuromuscular re-education, Balance training, Gait training, Patient/Family education, Joint mobilization, Dry Needling, Electrical stimulation, Spinal mobilization, Cryotherapy, Moist heat,  Taping, Traction, Ultrasound, Manual therapy, and Re-evaluation.  PLAN FOR NEXT SESSION: Further review of current exercise regime and creation of formal HEP for lumbopelvic flexibility and strengthening with extension bias; MT +/- DN to address abnormal muscle tension in R glutes/piriformis; modalities including possible mechanical traction PRN   Marry Guan, PT 03/31/2022, 3:09 PM

## 2022-03-31 NOTE — Progress Notes (Signed)
?  Kristen Gibbs - 41 y.o. female MRN [id number]  Date of birth: 1981/10/17 ? ?SUBJECTIVE:  Including CC & ROS.  ?No chief complaint on file. ? ? ?Kristen Gibbs is a 41 y.o. female that is following up for her right radicular pain and presenting with acute on chronic neck pain.  She got improvement with the recent epidural with the pain was relieved.  The pain has subsequently returned and is severe in nature.  She is having episodes of urinary incontinence from time to time.  She denies any saddle anesthesia. ? ? ? ?Review of Systems ?See HPI  ? ?HISTORY: Past Medical, Surgical, Social, and Family History Reviewed & Updated per EMR.   ?Pertinent Historical Findings include: ? ?History reviewed. No pertinent past medical history. ? ?History reviewed. No pertinent surgical history. ? ? ?PHYSICAL EXAM:  ?VS: BP 116/68 (BP Location: Left Arm, Patient Position: Sitting)   Ht 5\' 6"  (1.676 m)   Wt 180 lb (81.6 kg)   BMI 29.05 kg/m?  ?Physical Exam ?Gen: NAD, alert, cooperative with exam, well-appearing ?MSK:  ?Neurovascularly intact   ? ? ? ? ?ASSESSMENT & PLAN:  ? ?Lumbar radiculopathy ?Acutely worsening.  Did get relief with recent epidural but the pain has returned and is severe in nature.  Having urinary incontinence that seems more related to urge or stress incontinence as opposed to cauda equina. ?-Counseled on home exercise therapy and supportive care. ?-Referral to neurosurgery ? ?Cervical strain ?Acutely occurring.  Having pain with lateral rotation by the end of the day.  Seems more of a myofascial origin in nature. ?-Counseled on home exercise therapy and supportive care. ?-Pursue shockwave therapy. ? ? ? ? ?

## 2022-03-31 NOTE — Assessment & Plan Note (Signed)
Acutely occurring.  Having pain with lateral rotation by the end of the day.  Seems more of a myofascial origin in nature. ?-Counseled on home exercise therapy and supportive care. ?-Pursue shockwave therapy. ?

## 2022-04-03 ENCOUNTER — Other Ambulatory Visit: Payer: Self-pay | Admitting: Family Medicine

## 2022-04-03 ENCOUNTER — Ambulatory Visit: Payer: Medicaid Other

## 2022-04-03 ENCOUNTER — Ambulatory Visit: Payer: Medicaid Other | Admitting: Family Medicine

## 2022-04-03 DIAGNOSIS — M5412 Radiculopathy, cervical region: Secondary | ICD-10-CM

## 2022-04-03 NOTE — Progress Notes (Signed)
Patient continues to have right-sided trapezius and arm pain.  X-ray of the cervical spine from March was unrevealing.  She has tried greater than 6 weeks of home physician directed exercise therapy.  She has tried medications.  We will order MRI to evaluate for nerve impingement for consideration of epidural use.  Myra Rude, MD [facility] 04/03/2022, 1:02 PM

## 2022-04-04 ENCOUNTER — Ambulatory Visit: Payer: Medicaid Other | Admitting: Neurology

## 2022-04-22 ENCOUNTER — Other Ambulatory Visit: Payer: Medicaid Other

## 2022-04-22 ENCOUNTER — Ambulatory Visit: Payer: Medicaid Other

## 2022-04-26 ENCOUNTER — Ambulatory Visit (INDEPENDENT_AMBULATORY_CARE_PROVIDER_SITE_OTHER): Payer: Medicaid Other

## 2022-04-26 DIAGNOSIS — M5412 Radiculopathy, cervical region: Secondary | ICD-10-CM | POA: Diagnosis not present

## 2022-04-26 DIAGNOSIS — G8929 Other chronic pain: Secondary | ICD-10-CM

## 2022-04-26 DIAGNOSIS — M542 Cervicalgia: Secondary | ICD-10-CM

## 2022-04-26 IMAGING — MR MR CERVICAL SPINE W/O CM
5 series · 41 of 48 positions shown · non-contrast
Comparison: Cervical spine radiographs [DATE]

CLINICAL DATA: Neck pain, chronic.  Bilateral cervical pain.

EXAM:
MRI CERVICAL SPINE WITHOUT CONTRAST
TECHNIQUE: Multiplanar, multisequence MR imaging of the cervical spine was
performed. No intravenous contrast was administered.

[Series 2: T2 · sagittal · 3.0mm · 0.86mm/px · 6 of 13 slices shown (1 of 2)]
[im 1/13]
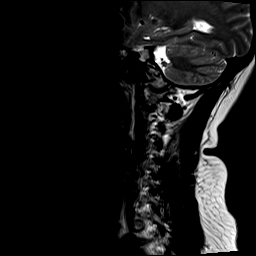
[im 3/13]
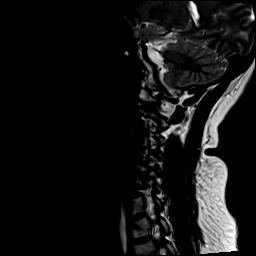
[im 5/13]
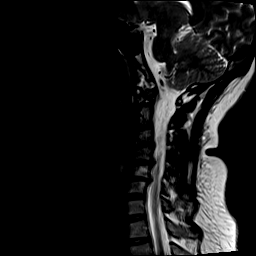
[im 8/13]
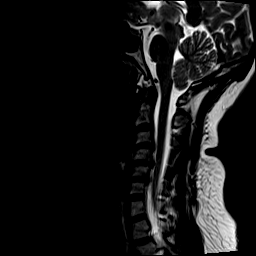
[im 10/13]
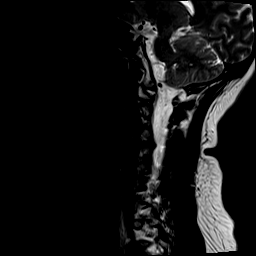
[im 13/13]
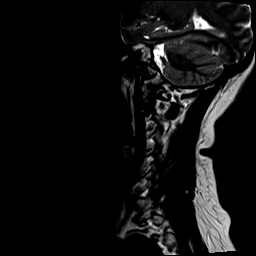

[Series 3: T1 · sagittal · 3.0mm · 0.86mm/px · 6 of 13 slices shown]
[im 1/13]
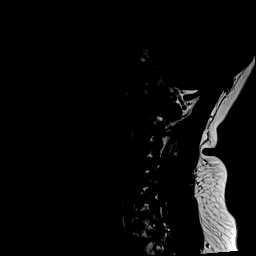
[im 3/13]
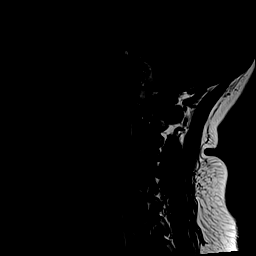
[im 5/13]
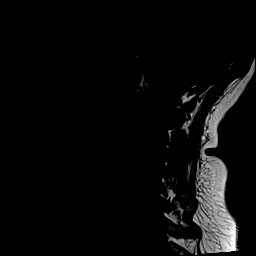
[im 8/13]
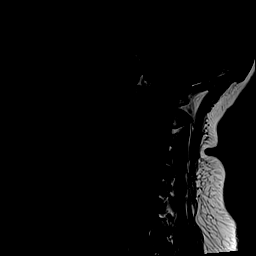
[im 10/13]
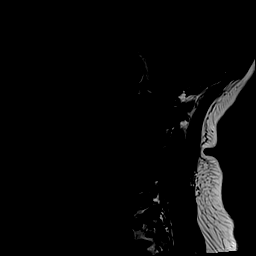
[im 13/13]
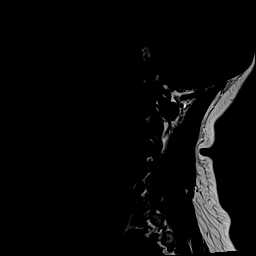

[Series 4: STIR · sagittal · 3.0mm · 0.69mm/px · 6 of 13 slices shown]
[im 1/13]
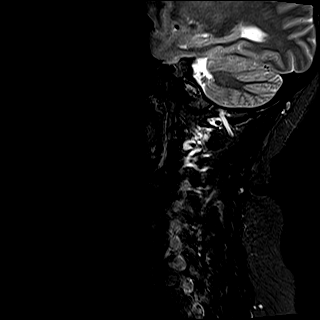
[im 3/13]
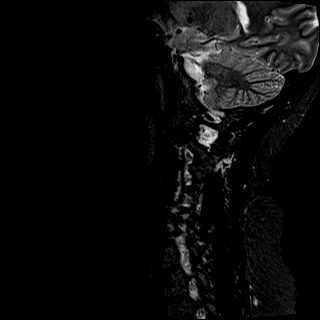
[im 5/13]
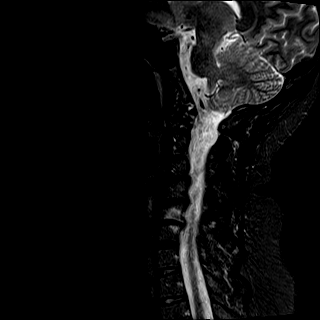
[im 8/13]
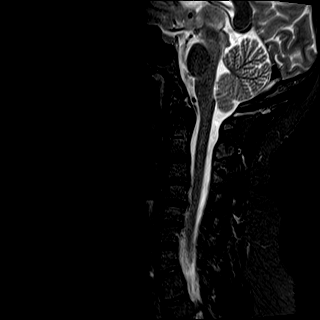
[im 10/13]
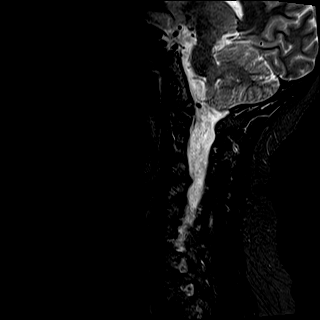
[im 13/13]
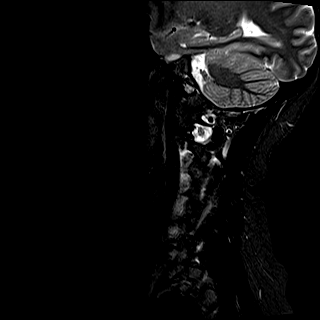

[Series 5: T2 · axial · 3.0mm · 0.70mm/px · z∈[-102,-3]mm · 15 of 31 slices shown (2 of 2)]
[im 1/31]
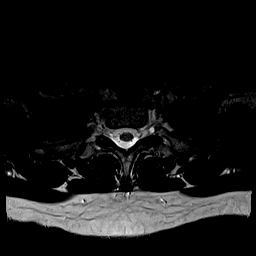
[im 3/31]
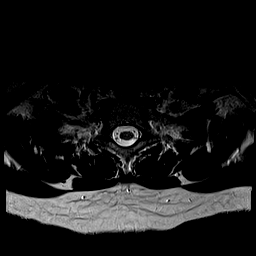
[im 5/31]
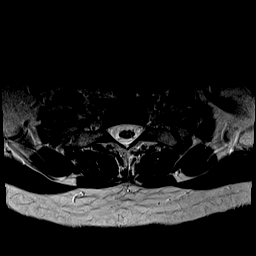
[im 7/31]
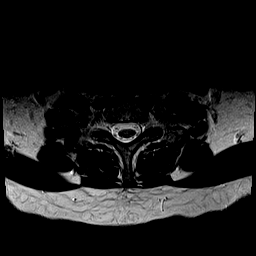
[im 9/31]
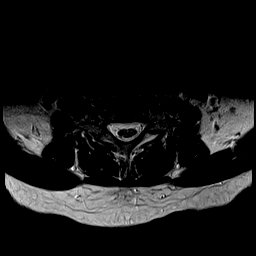
[im 11/31]
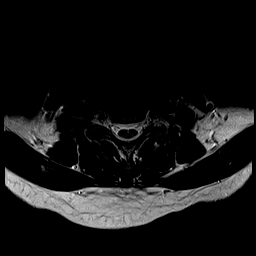
[im 13/31]
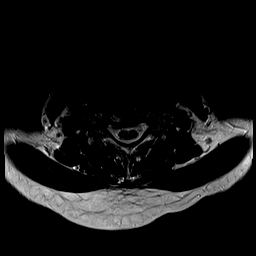
[im 16/31]
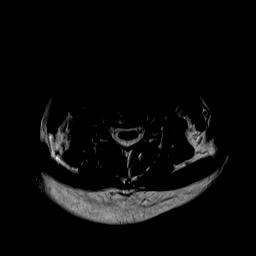
[im 18/31]
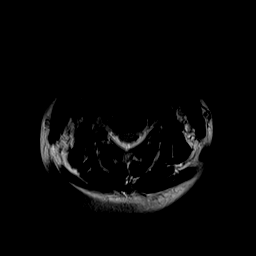
[im 20/31]
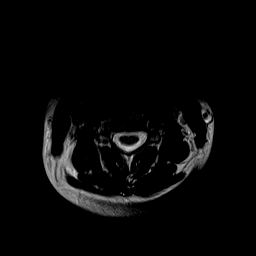
[im 22/31]
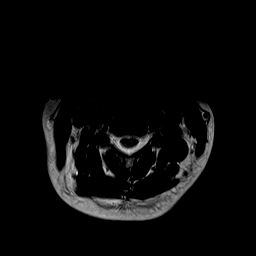
[im 24/31]
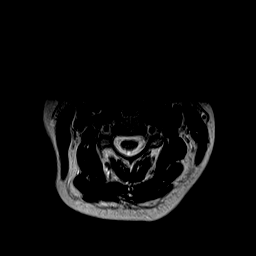
[im 26/31]
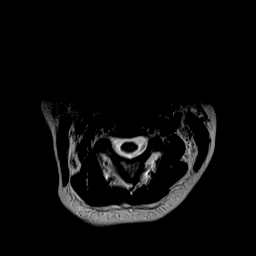
[im 28/31]
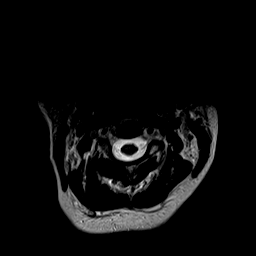
[im 31/31]
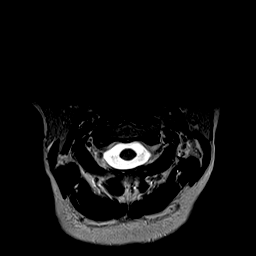

[Series 6: ax mpgr · axial · 3.0mm · 0.35mm/px · z∈[-102,-3]mm · 8 of 31 slices shown]
[im 1/31]
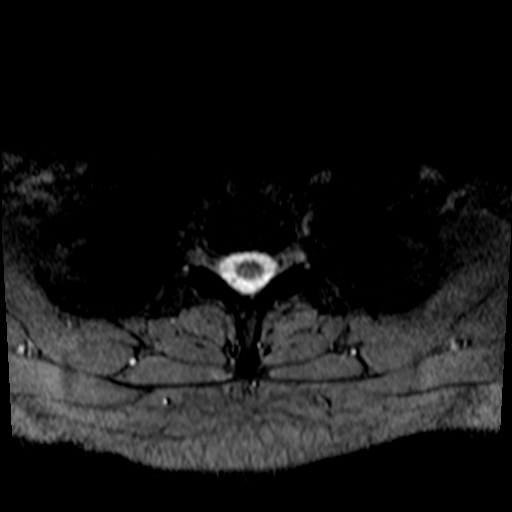
[im 5/31]
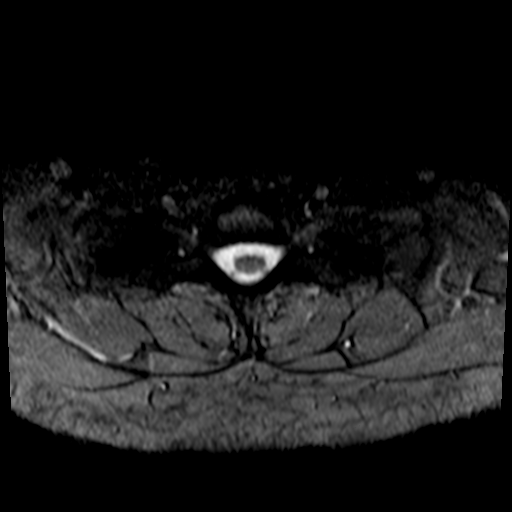
[im 9/31]
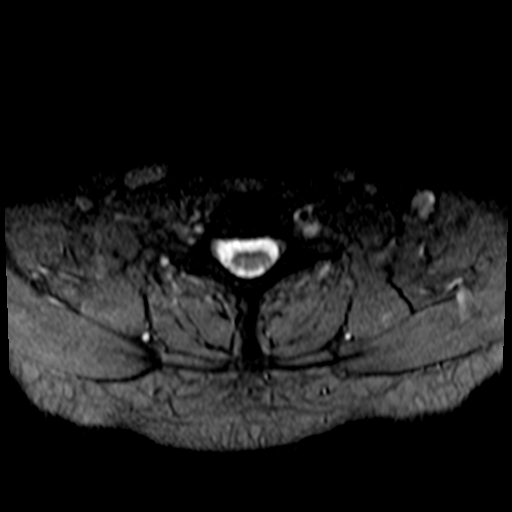
[im 13/31]
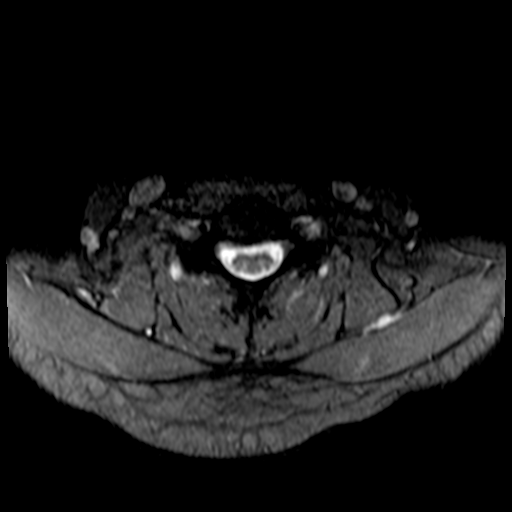
[im 18/31]
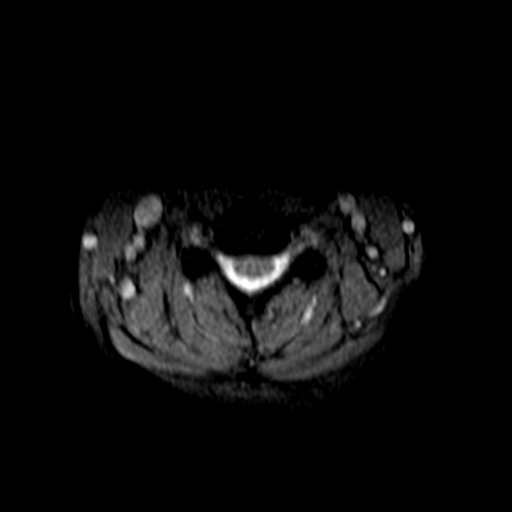
[im 22/31]
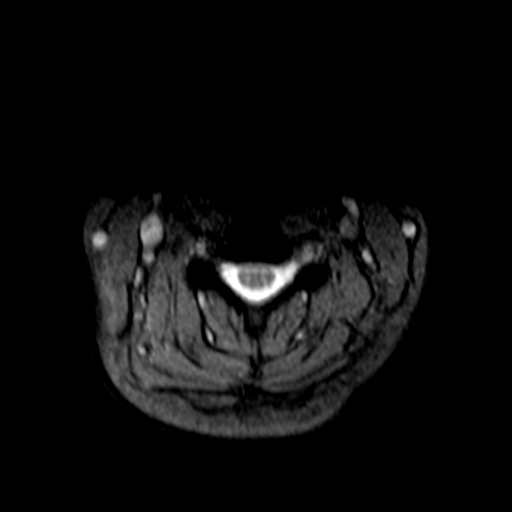
[im 26/31]
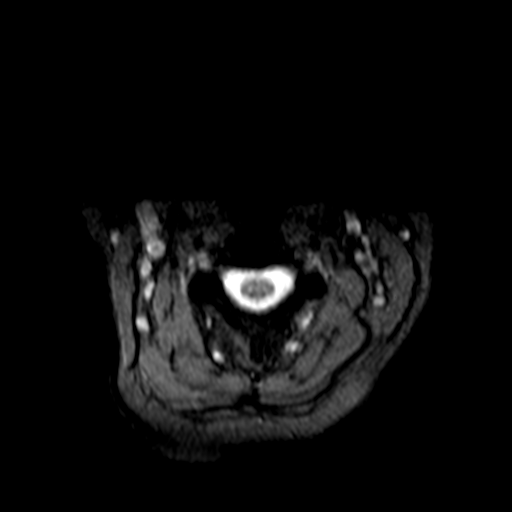
[im 31/31]
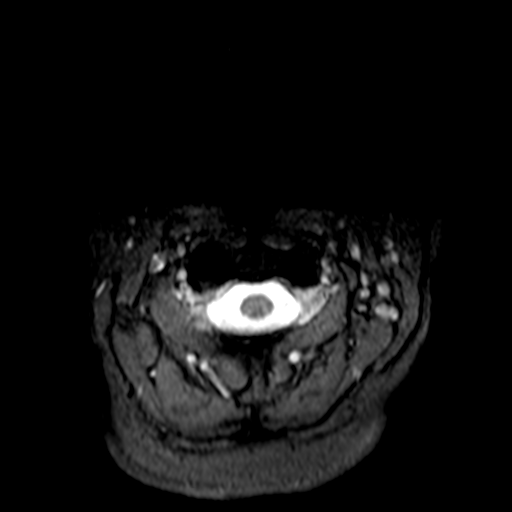

[41 of 48 positions shown; findings below may reference images not displayed]

FINDINGS: Alignment: No significant listhesis is present. Straightening of the
normal cervical lordosis is present.

Vertebrae: Marrow signal and vertebral body heights are normal.

Cord: Normal signal and morphology.

Posterior Fossa, vertebral arteries, paraspinal tissues:
Craniocervical junction is normal. Flow is present in the vertebral
arteries bilaterally. Visualized intracranial contents are normal.
Cerebellar tonsils extend to the foramen magnum. Tonsils are
rounded. Adequate CSF space is noted.

Disc levels:

C1-2: Negative.

C2-3: Negative.

C3-4: Mild uncovertebral spurring is present bilaterally without
significant stenosis.

C4-5: A broad-based disc osteophyte complex effaces the ventral CSF
uncovertebral spurring results in moderate right foraminal stenosis.

C5-6: A rightward disc osteophyte complex partially effaces the
ventral CSF. Uncovertebral spurring results in moderate right and
mild left foraminal stenosis.

C6-7: Negative.

C7-T1: Negative.
IMPRESSION: 1. Moderate right foraminal stenosis at C4-5 and C5-6 secondary to a
rightward disc osteophyte complex and uncovertebral spurring at both
levels.
2. Mild left foraminal narrowing at C5-6.
3. Mild uncovertebral spurring at C3-4 without significant stenosis.

## 2022-04-29 ENCOUNTER — Ambulatory Visit: Payer: Medicaid Other | Admitting: Physical Therapy

## 2022-04-29 ENCOUNTER — Encounter: Payer: Self-pay | Admitting: Physical Therapy

## 2022-04-30 ENCOUNTER — Telehealth (INDEPENDENT_AMBULATORY_CARE_PROVIDER_SITE_OTHER): Payer: Medicaid Other | Admitting: Family Medicine

## 2022-04-30 DIAGNOSIS — M5412 Radiculopathy, cervical region: Secondary | ICD-10-CM

## 2022-04-30 NOTE — Progress Notes (Signed)
Virtual Visit via Video Note  I connected with Kristen Gibbs on 04/30/22 at  2:10 PM EDT by a video enabled telemedicine application and verified that I am speaking with the correct person using two identifiers.  Location: Patient: home Provider: office   I discussed the limitations of evaluation and management by telemedicine and the availability of in person appointments. The patient expressed understanding and agreed to proceed.  History of Present Illness:  Kristen Gibbs is a 41 year old female that is following up after her MRI of her cervical spine.  She continues to have right-sided pain and the MRI was demonstrating moderate right foraminal stenosis at C4-5 and C5-6.   Observations/Objective:   Assessment and Plan:  Cervical radiculopathy: Acute on chronic in nature.  MRI was demonstrating changes on the right side as the source of her pain. -Counseled on home exercise therapy and supportive care. -Continue physical therapy. -Could consider an epidural.  Follow Up Instructions:    I discussed the assessment and treatment plan with the patient. The patient was provided an opportunity to ask questions and all were answered. The patient agreed with the plan and demonstrated an understanding of the instructions.   The patient was advised to call back or seek an in-person evaluation if the symptoms worsen or if the condition fails to improve as anticipated.   Clearance Coots, MD

## 2022-04-30 NOTE — Assessment & Plan Note (Signed)
Acute on chronic in nature.  MRI was demonstrating changes on the right side as the source of her pain. -Counseled on home exercise therapy and supportive care. -Continue physical therapy. -Could consider an epidural.

## 2022-05-02 ENCOUNTER — Telehealth: Payer: Medicaid Other | Admitting: Emergency Medicine

## 2022-05-02 ENCOUNTER — Telehealth: Payer: Self-pay

## 2022-05-02 DIAGNOSIS — E663 Overweight: Secondary | ICD-10-CM | POA: Diagnosis not present

## 2022-05-02 NOTE — Telephone Encounter (Signed)
Okay w/ transfer?

## 2022-05-02 NOTE — Telephone Encounter (Signed)
Pt requesting to switch to female provider. Will either you or Melissa be willing to accept her?

## 2022-05-02 NOTE — Progress Notes (Signed)
Virtual Visit Consent   Kristen Gibbs, you are scheduled for a virtual visit with a [facility] provider today. Just as with appointments in the office, your consent must be obtained to participate. Your consent will be active for this visit and any virtual visit you may have with one of our providers in the next 365 days. If you have a MyChart account, a copy of this consent can be sent to you electronically.  As this is a virtual visit, video technology does not allow for your provider to perform a traditional examination. This may limit your provider's ability to fully assess your condition. If your provider identifies any concerns that need to be evaluated in person or the need to arrange testing (such as labs, EKG, etc.), we will make arrangements to do so. Although advances in technology are sophisticated, we cannot ensure that it will always work on either your end or our end. If the connection with a video visit is poor, the visit may have to be switched to a telephone visit. With either a video or telephone visit, we are not always able to ensure that we have a secure connection.  By engaging in this virtual visit, you consent to the provision of healthcare and authorize for your insurance to be billed (if applicable) for the services provided during this visit. Depending on your insurance coverage, you may receive a charge related to this service.  I need to obtain your verbal consent now. Are you willing to proceed with your visit today? Kristen Gibbs has provided verbal consent on 05/02/2022 for a virtual visit (video or telephone). Kristen Parsons, NP  Date: 05/02/2022 12:54 PM  Virtual Visit via Video Note   I, Kristen Gibbs, connected with  Kristen Gibbs  ([id number], [birth year]) on 05/02/22 at 11:45 AM EDT by a video-enabled telemedicine application and verified that I am speaking with the correct person using two identifiers.  Location: Patient: Virtual Visit Location Patient:  Other: a park in [place] Provider: Virtual Visit Location Provider: Home Office   I discussed the limitations of evaluation and management by telemedicine and the availability of in person appointments. The patient expressed understanding and agreed to proceed.    History of Present Illness: Kristen Gibbs is a 41 y.o. who identifies as a female who was assigned female at birth, and is being seen today for advice on medication. Pt has been taking metformin for weight loss but it causes GI distress. Her PCP advised her to stop it but she has not done so as she wasn't sure she could stop it without tapering.   Pt also wishes to discuss how to find a woman PCP which is her preference.   HPI: HPI  Problems:  Patient Active Problem List   Diagnosis Date Noted   Cervical radiculopathy 03/31/2022   Lumbar radiculopathy 02/24/2022    Allergies: No Known Allergies Medications:  Current Outpatient Medications:    acetaminophen (TYLENOL) 500 MG tablet, Take 500 mg by mouth every 6 (six) hours as needed., Disp: , Rfl:    buPROPion (WELLBUTRIN) 75 MG tablet, Take 1 tablet (75 mg total) by mouth 2 (two) times daily., Disp: 60 tablet, Rfl: 3   cetirizine (ZYRTEC) 10 MG tablet, Take 1 tablet (10 mg total) by mouth daily., Disp: 30 tablet, Rfl: 3   diazepam (VALIUM) 5 MG tablet, Please take 30-45 minutes prior to procedure. May repeat x 1., Disp: 2 tablet, Rfl: 0   gabapentin (NEURONTIN) 300 MG capsule, Take  1 capsule (300 mg total) by mouth at bedtime., Disp: 30 capsule, Rfl: 5   imiquimod (ALDARA) 5 % cream, Apply topically 3 (three) times a week., Disp: 12 each, Rfl: 3   metFORMIN (GLUCOPHAGE) 500 MG tablet, Take 1 tablet (500 mg total) by mouth 2 (two) times daily with a meal., Disp: 60 tablet, Rfl: 3   naproxen (NAPROSYN) 500 MG tablet, Take 500 mg by mouth 2 (two) times daily with a meal., Disp: , Rfl:   Observations/Objective: Patient is well-developed, well-nourished in no acute distress.  Resting  comfortably  at a park.  Head is normocephalic, atraumatic.  No labored breathing.  Speech is clear and coherent with logical content.  Patient is alert and oriented at baseline.    Assessment and Plan: 1. Overweight  Stop meetformin. Call PCP office to switch providers.   Follow Up Instructions: I discussed the assessment and treatment plan with the patient. The patient was provided an opportunity to ask questions and all were answered. The patient agreed with the plan and demonstrated an understanding of the instructions.  A copy of instructions were sent to the patient via MyChart unless otherwise noted below.   Patient has requested to receive PHI (AVS, Work Notes, etc) pertaining to this video visit through e-mail as they are currently without active MyChart. They have voiced understand that email is not considered secure and their health information could be viewed by someone other than the patient.   The patient was advised to call back or seek an in-person evaluation if the symptoms worsen or if the condition fails to improve as anticipated.  Time:  I spent 14 minutes with the patient via telehealth technology discussing the above problems/concerns.    Kristen Parsons, NP

## 2022-05-02 NOTE — Telephone Encounter (Signed)
OK 

## 2022-05-02 NOTE — Telephone Encounter (Signed)
LMOM asking Pt to return call to schedule transfer of care appt.

## 2022-05-02 NOTE — Patient Instructions (Signed)
  [name], thank you for joining Cathlyn Parsons, NP for today's virtual visit.  While this provider is not your primary care provider (PCP), if your PCP is located in our provider database this encounter information will be shared with them immediately following your visit.  Consent: (Patient) Kristen Gibbs provided verbal consent for this virtual visit at the beginning of the encounter.  Current Medications:  Current Outpatient Medications:    acetaminophen (TYLENOL) 500 MG tablet, Take 500 mg by mouth every 6 (six) hours as needed., Disp: , Rfl:    buPROPion (WELLBUTRIN) 75 MG tablet, Take 1 tablet (75 mg total) by mouth 2 (two) times daily., Disp: 60 tablet, Rfl: 3   cetirizine (ZYRTEC) 10 MG tablet, Take 1 tablet (10 mg total) by mouth daily., Disp: 30 tablet, Rfl: 3   diazepam (VALIUM) 5 MG tablet, Please take 30-45 minutes prior to procedure. May repeat x 1., Disp: 2 tablet, Rfl: 0   gabapentin (NEURONTIN) 300 MG capsule, Take 1 capsule (300 mg total) by mouth at bedtime., Disp: 30 capsule, Rfl: 5   imiquimod (ALDARA) 5 % cream, Apply topically 3 (three) times a week., Disp: 12 each, Rfl: 3   metFORMIN (GLUCOPHAGE) 500 MG tablet, Take 1 tablet (500 mg total) by mouth 2 (two) times daily with a meal., Disp: 60 tablet, Rfl: 3   naproxen (NAPROSYN) 500 MG tablet, Take 500 mg by mouth 2 (two) times daily with a meal., Disp: , Rfl:    Medications ordered in this encounter:  No orders of the defined types were placed in this encounter.    *If you need refills on other medications prior to your next appointment, please contact your pharmacy*  Follow-Up: Call back or seek an in-person evaluation if the symptoms worsen or if the condition fails to improve as anticipated.  Other Instructions Talk with Mr. Milagros Reap office about switching to a woman provider. It's ok to stop the metformin without tapering it.    If you have been instructed to have an in-person evaluation today at a  local Urgent Care facility, please use the link below. It will take you to a list of all of our available [facility], including address, phone number and hours of operation. Please do not delay care.  [facility]  If you or a family member do not have a primary care provider, use the link below to schedule a visit and establish care. When you choose a [place] [facility] primary care physician or advanced practice provider, you gain a long-term partner in health. Find a Primary Care Provider  Learn more about [facility]'s in-office and virtual care options: [facility] - Get Care Now

## 2022-05-05 ENCOUNTER — Other Ambulatory Visit: Payer: Self-pay | Admitting: Medical

## 2022-05-05 NOTE — Telephone Encounter (Signed)
Patient has an appt scheduled for 05/28/22

## 2022-05-06 ENCOUNTER — Ambulatory Visit: Payer: Medicaid Other | Attending: Family Medicine | Admitting: Physical Therapy

## 2022-05-06 DIAGNOSIS — M5416 Radiculopathy, lumbar region: Secondary | ICD-10-CM | POA: Diagnosis present

## 2022-05-06 DIAGNOSIS — M5412 Radiculopathy, cervical region: Secondary | ICD-10-CM | POA: Diagnosis present

## 2022-05-06 DIAGNOSIS — M542 Cervicalgia: Secondary | ICD-10-CM | POA: Insufficient documentation

## 2022-05-06 DIAGNOSIS — M62830 Muscle spasm of back: Secondary | ICD-10-CM | POA: Insufficient documentation

## 2022-05-06 DIAGNOSIS — M6281 Muscle weakness (generalized): Secondary | ICD-10-CM | POA: Insufficient documentation

## 2022-05-06 DIAGNOSIS — M5459 Other low back pain: Secondary | ICD-10-CM | POA: Insufficient documentation

## 2022-05-06 DIAGNOSIS — R293 Abnormal posture: Secondary | ICD-10-CM | POA: Diagnosis present

## 2022-05-06 NOTE — Addendum Note (Signed)
Addended by: Myra Rude on: 05/06/2022 01:56 PM   Modules accepted: Orders

## 2022-05-06 NOTE — Therapy (Addendum)
OUTPATIENT PHYSICAL THERAPY TREATMENT   Patient Name: Kristen Gibbs MRN: [id number] DOB:[birth year], 41 y.o., female Today's Date: 05/06/2022   PT End of Session - 05/06/22 1319     Visit Number 2    Number of Visits 13    Date for PT Re-Evaluation 05/12/22    Authorization Type [facility] Medicaid    Authorization Time Period 03/31/22 - 05/29/22    Authorization - Visit Number 1    Authorization - Number of Visits 7    PT Start Time 1319   Pt arrived late   PT Stop Time 1404    PT Time Calculation (min) 45 min    Activity Tolerance Patient tolerated treatment well;Patient limited by pain    Behavior During Therapy [facility] for tasks assessed/performed              No past medical history on file. No past surgical history on file. Patient Active Problem List   Diagnosis Date Noted   Cervical radiculopathy 03/31/2022   Lumbar radiculopathy 02/24/2022    PCP: Esperanza Richters, PA-C   REFERRING PROVIDER: Myra Rude, MD  REFERRING DIAG: [phone] (ICD-10-CM) - Lumbar radiculopathy  THERAPY DIAG:  Radiculopathy, lumbar region  Other low back pain  Muscle spasm of back  Muscle weakness (generalized)  Abnormal posture  RATIONALE FOR EVALUATION AND TREATMENT: Rehabilitation  ONSET DATE: chronic  SUBJECTIVE:                                                                                                                                                                                           SUBJECTIVE STATEMENT: Pt reports her neck pain has horrible for the past few weeks which has made her back pain worse. She states her cervical MRI was completed and Dr. Jordan Likes wants her to keep working with PT for her back and neck (initial referral only for low back), but he is also referring her to a surgeon for consultation.  PAIN:  Are you having pain? Yes: NPRS scale: 5-6/10 Pain location: R low back, extending up back and down R LE Pain description: sharp, shooting,  radiating ache down her leg Aggravating factors: worse with bending; prolonged sitting, standing or walking; lifting Relieving factors: walk around; stretching; alternating positions; lidocaine patches  Are you having pain? Yes: NPRS scale: 6-7/10 Pain location: R neck & shoulder Pain description: sharp, stiff, pressure points Aggravating factors: push/pull with arms Relieving factors: lidocaine patches, sleep  PERTINENT HISTORY:   Recurrent major depressive disorder, in partial remission (CMS-HCC)   Endometriosis   Non-seasonal allergic rhinitis due to pollen   Cervical dysplasia   Status post LEEP (  loop electrosurgical excision procedure) of cervix   Irritable bowel syndrome with diarrhea   Gastroesophageal reflux disease without esophagitis   Neck pain   Mechanical low back pain   Low libido   Acute non-recurrent sinusitis   PRECAUTIONS: None  WEIGHT BEARING RESTRICTIONS No  FALLS:  Has patient fallen in last 6 months? No  LIVING ENVIRONMENT: Lives with: lives with their family and lives with their spouse Lives in: House/apartment Stairs: Yes: Internal: 15-20 steps; on left going up and External: 2 steps; none Has following equipment at home: None   OCCUPATION: unemployed  PLOF: Independent and Leisure: anything outdoors - gardening, hiking, swimming, playing ball with 4 dogs, walking with her son  PATIENT GOALS "Maybe learn some stretches or something that will help so I can maybe not be in pain."   OBJECTIVE:   DIAGNOSTIC FINDINGS:  Lumbar MRI - 03/01/22: 1. Subtle right foraminal disc protrusion with annular fissure at L5-S1, closely approximating and/or contacting the exiting right L5 nerve root.  2. Otherwise essentially normal MRI of the lumbar spine. Cervical MRI - 04/26/22:  1. Moderate right foraminal stenosis at C4-5 and C5-6 secondary to a rightward disc osteophyte complex and uncovertebral spurring at both levels.  2. Mild left foraminal narrowing at C5-6.   3. Mild uncovertebral spurring at C3-4 without significant stenosis.  PATIENT SURVEYS:  Modified Oswestry = 29, 58% disability  FOTO Lumbar = 42, predicted = 52  SCREENING FOR RED FLAGS: Bowel or bladder incontinence: Yes: stress incontinence over the past week Spinal tumors: No Cauda equina syndrome: No Compression fracture: No Abdominal aneurysm: No  COGNITION:  Overall cognitive status: Within functional limits for tasks assessed     SENSATION: Hot/Cold: Impaired  and delayed processing of hot Intermittent numbness in toes and bottom of feet  MUSCLE LENGTH: Hamstrings: mod tight B Piriformis: mild/mod tight L>R Quad/hip flexors: mod tight R>L ITB: mod tight R, mild tight L  POSTURE:  Lumbar spine WNL in standing but forward head and rounded shoulders  PALPATION: (+) TTP over R piriformis > glutes; (-) TTP in lumbar paraspinals  LUMBAR ROM:   Active  A/PROM  Eval - 03/31/2022  Flexion Hands to ankles - increased pain  Extension Mildly limited - decreases LBP/buttock pain but increases neck pain  Right lateral flexion Fingertips to lateral joint line - tight  Left lateral flexion Fingertips to lateral joint line - tight  Right rotation 50% limited - pain in R buttock  Left rotation 40% limited   (Blank rows = not tested)  LE MMT:  MMT Right 03/31/22 Left 03/31/22  Hip flexion 4 4+  Hip extension 4 4+  Hip abduction 4 4  Hip adduction 4 * 4+  Hip internal rotation 4 * 4+  Hip external rotation 4 * 4+  Knee flexion 4 4+  Knee extension 4+ 5  Ankle dorsiflexion 4- 4  Ankle plantarflexion    Ankle inversion    Ankle eversion     (Blank rows = not tested)  LUMBAR SPECIAL TESTS:  Straight leg raise test: Negative and Slump test: Positive   TODAY'S TREATMENT   05/06/22 THERAPEUTIC EXERCISE: to improve flexibility, strength and mobility.  Verbal and tactile cues throughout for technique. Rec Bike - L2 x 6 min Hookying R/L hamstring stretch with strap 2 x  30 sec, + DF on 2nd rep for calf stretch Supine R/L ITB stretch cross body with strap 2 x 30 sec Mod thomas R/L hip flexor/quad stretch with strap  2 x 30 sec Hookying R/L figure-4 piriformis stretch 2 x 30 sec - better stretch on L Hookying R/L KTOS piriformis stretch 2 x 30 sec - pt preferring over figure-4 on R Abdominal bracing/PPT 10 x 5"   03/31/22 Brief review of current self-created HEP - pt encouraged to focus on stretching with neutral spine and extension exercises avoiding sit-ups    PATIENT EDUCATION:  Education details: initial HEP and posture and body mechanics for typical daily postioning, mobility and household tasks  Person educated: Patient Education method: Explanation, Demonstration, Tactile cues, Verbal cues, and MedBridge code texted to pt Education comprehension: verbalized understanding, returned demonstration, verbal cues required, and needs further education   HOME EXERCISE PROGRAM: Access Code: [email or web address] (texted to patient on 05/06/22)  Exercises - Supine Hamstring Stretch with Strap  - 2 x daily - 7 x weekly - 3 reps - 30 sec hold - Supine ITB Stretch with Strap  - 2 x daily - 7 x weekly - 3 reps - 30 sec hold - Supine Quadriceps Stretch with Strap on Table  - 2 x daily - 7 x weekly - 3 reps - 30 sec hold - Supine Figure 4 Piriformis Stretch  - 2 x daily - 7 x weekly - 3 reps - 30 sec hold - Supine Piriformis Stretch with Foot on Ground  - 2 x daily - 7 x weekly - 3 reps - 30 sec hold - Supine Posterior Pelvic Tilt  - 2 x daily - 7 x weekly - 2 sets - 10 reps - 5 sec hold  Patient Education - Posture and Body Mechanics  ASSESSMENT:  CLINICAL IMPRESSION: Noelani returning for first time since eval 5 weeks ago. She reports continued high pain levels in both her neck and back with R sided radiculopathy present in both R UE and LE. Today's session focusing on creation of an initial HEP to address proximal LE and lumbopelvic tightness and weakness. Stretches  initiated in supine/hooklying to promoted neutral spine alignment with pt able to elicit desired stretches and muscle activation as each attempted but not telling PT that holding the strap was making her neck pain worse until exercises being review for HEP at end of session, hence may need to provide alternative options next visit. Posture and body mechanics briefly reviewed with pt and handout attached to HEP - pt will likely need more formal instruction in upcoming visits. Intent for PT to address cervical pain and radiculopathy clarified with MD - new order received and will plan for re-eval next visit to formally assess neck concerns.   OBJECTIVE IMPAIRMENTS decreased activity tolerance, decreased knowledge of condition, decreased mobility, difficulty walking, decreased ROM, decreased strength, increased fascial restrictions, impaired perceived functional ability, increased muscle spasms, impaired flexibility, impaired sensation, improper body mechanics, postural dysfunction, and pain.   ACTIVITY LIMITATIONS cleaning, community activity, driving, meal prep, laundry, yard work, and shopping.   PERSONAL FACTORS Past/current experiences, Time since onset of injury/illness/exacerbation, and 3+ comorbidities: anxiety and depression, endometriosis, cervical dysplasia, IBS with diarrhea, GERD, neck pain, and allergies are also affecting patient's functional outcome.    REHAB POTENTIAL: Good  CLINICAL DECISION MAKING: Evolving/moderate complexity  EVALUATION COMPLEXITY: Moderate   GOALS: Goals reviewed with patient? Yes  SHORT TERM GOALS: Target date: 04/21/2022  Patient will be independent with initial HEP.  Baseline: Leahanna has been performing self-created exercise program but no formal HEP. Goal status: IN PROGRESS  05/06/22 - Pt returning for first time since eval 5 weeks ago,  Initial HEP provided  2.  Patient will report centralization of radicular symptoms.  Baseline: Chequita reports near  constant R LE radicular pain with intermittent numbness to R toes. Goal status: IN PROGRESS  05/06/22 - Pt returning for first time since eval 5 weeks ago  3.  Patient will verbalize/demonstrate understanding of neutral spine posture and proper body mechanics to reduce strain on lumbar spine Baseline: Lumbar spine WNL in standing but forward head and rounded shoulders Goal status: IN PROGRESS  05/06/22 - Pt returning for first time since eval 5 weeks ago   LONG TERM GOALS: Target date: 05/12/2022  Patient will be independent with advanced/ongoing HEP to improve outcomes and carryover.  Baseline: HEP TBD Goal status: IN PROGRESS  05/06/22 - Initial HEP provided  2.  Patient to report reduction in frequency and intensity of LBP by >/= 50-75% to allow for improved activity tolerance Baseline: Near constant LBP at >/= 3/10 Goal status: IN PROGRESS  3.  Patient will demonstrate full pain free lumbar ROM to perform ADLs.  Baseline: Limited lumbar ROM with pain in flexion and R rotation Goal status: IN PROGRESS   4.  Patient will report 4 on lumbar FOTO to demonstrate improved functional ability.  Baseline: 42 Goal status: IN PROGRESS  5.  Patient will demonstrate improved B LE strength to >/= 4+/5 for improved stability and ease of mobility  Baseline: Refer to above MMT chart Goal status: IN PROGRESS  6.  Improve understanding of body mechanics and spine care with patient to verbalize and demo correct lifting techniques Baseline:  Goal status: IN PROGRESS   7. Patient to report ability to perform ADLs, household, and leisure activities without limitation due to LBP or R LE radicular pain, LOM or weakness Baseline: Pain is limiting her ability to perform ADLs, household chores, yardwork, and exercise Goal status: IN PROGRESS   PLAN: PT FREQUENCY: 2x/week  PT DURATION: 6 weeks  PLANNED INTERVENTIONS: Therapeutic exercises, Therapeutic activity, Neuromuscular re-education, Balance  training, Gait training, Patient/Family education, Joint mobilization, Dry Needling, Electrical stimulation, Spinal mobilization, Cryotherapy, Moist heat, Taping, Traction, Ultrasound, Manual therapy, and Re-evaluation.  PLAN FOR NEXT SESSION: Re-Eval for cervical radiculopathy; review of initial HEP with modifications of stretches to options w/o strap as needed + addition of exercises for neck as indicated; progress lumbopelvic flexibility and strengthening with extension bias; MT +/- DN to address abnormal muscle tension in R glutes/piriformis; modalities including possible mechanical traction PRN   Marry Guan, PT 05/06/2022, 5:49 PM

## 2022-05-08 ENCOUNTER — Ambulatory Visit: Payer: Medicaid Other | Admitting: Physical Therapy

## 2022-05-08 ENCOUNTER — Encounter: Payer: Self-pay | Admitting: Physical Therapy

## 2022-05-08 DIAGNOSIS — M5412 Radiculopathy, cervical region: Secondary | ICD-10-CM

## 2022-05-08 DIAGNOSIS — M6281 Muscle weakness (generalized): Secondary | ICD-10-CM

## 2022-05-08 DIAGNOSIS — M5459 Other low back pain: Secondary | ICD-10-CM

## 2022-05-08 DIAGNOSIS — M542 Cervicalgia: Secondary | ICD-10-CM

## 2022-05-08 DIAGNOSIS — M5416 Radiculopathy, lumbar region: Secondary | ICD-10-CM

## 2022-05-08 DIAGNOSIS — R293 Abnormal posture: Secondary | ICD-10-CM

## 2022-05-08 DIAGNOSIS — M62830 Muscle spasm of back: Secondary | ICD-10-CM

## 2022-05-08 NOTE — Therapy (Addendum)
OUTPATIENT PHYSICAL THERAPY RE-EVAL & TREATMENT / DISCHARGE SUMMARY   Patient Name: Kristen Gibbs MRN: [id number] DOB:09-27-1981, 41 y.o., female Today's Date: 05/08/2022   PT End of Session - 05/08/22 1317     Visit Number 3    Number of Visits 15    Date for PT Re-Evaluation 06/19/22    Authorization Type [facility] Medicaid    Authorization Time Period 03/31/22 - 05/29/22    Authorization - Visit Number 2    Authorization - Number of Visits 7    PT Start Time 7371    PT Stop Time 1404    PT Time Calculation (min) 47 min    Activity Tolerance Patient tolerated treatment well;Patient limited by pain    Behavior During Therapy [facility] for tasks assessed/performed               No past medical history on file. No past surgical history on file. Patient Active Problem List   Diagnosis Date Noted   Cervical radiculopathy 03/31/2022   Lumbar radiculopathy 02/24/2022    PCP: Mackie Pai, PA-C   REFERRING PROVIDER: Rosemarie Ax, MD  REFERRING DIAG: [phone] (ICD-10-CM) - Lumbar radiculopathy; M54.12 (ICD-10-CM) - Cervical radiculopathy  THERAPY DIAG:  Radiculopathy, lumbar region  Other low back pain  Muscle spasm of back  Muscle weakness (generalized)  Abnormal posture  Cervicalgia  Radiculopathy, cervical region  RATIONALE FOR EVALUATION AND TREATMENT: Rehabilitation  ONSET DATE: chronic  SUBJECTIVE:                                                                                                                                                                                           SUBJECTIVE STATEMENT: Pt reports she finds it less stressful on her neck to use her hands rather than the strap for her LE/low back stretches. As for her neck pain, she states her neck just started bothering her for the last 6 months w/o known MOI, but maybe in compensation for her low back. 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  PAIN:  Are you having pain? Yes: NPRS scale: 0/10 unless she bends down, 7-8/10 Pain location: R low back, extending up back and down R LE Pain description: sharp, shooting, radiating ache down her leg Aggravating factors: worse with bending; prolonged sitting, standing or walking; lifting Relieving factors: walk around; stretching; alternating positions; lidocaine patches  Are you having pain? Yes: NPRS scale: 5-6/10 Pain location: R neck & shoulder Pain description: sharp, stiff, pressure points Aggravating factors: push/pull with arms Relieving factors: lidocaine patches, icy/hot, sleep  PERTINENT HISTORY:  Recurrent major depressive disorder, in partial remission (CMS-HCC)   Endometriosis   Non-seasonal allergic rhinitis due to pollen   Cervical dysplasia   Status post LEEP (loop electrosurgical excision procedure) of cervix   Irritable bowel syndrome with diarrhea   Gastroesophageal reflux disease without esophagitis   Neck pain   Mechanical low back pain   Low libido   Acute non-recurrent sinusitis   PRECAUTIONS: None  WEIGHT BEARING RESTRICTIONS No  FALLS:  Has patient fallen in last 6 months? No  LIVING ENVIRONMENT: Lives with: lives with their family and lives with their spouse Lives in: House/apartment Stairs: Yes: Internal: 15-20 steps; on left going up and External: 2 steps; none Has following equipment at home: None  OCCUPATION: unemployed  PLOF: Independent and Leisure: anything outdoors - gardening, hiking, swimming, playing ball with 4 dogs, walking with her son  PATIENT GOALS "Maybe learn some stretches or something that will help so I can maybe not be in pain."   OBJECTIVE:   DIAGNOSTIC FINDINGS:  Lumbar MRI - 03/01/22: 1. Subtle right foraminal disc protrusion with annular fissure at L5-S1, closely approximating and/or contacting the exiting right L5 nerve root.  2. Otherwise essentially normal MRI of the lumbar  spine. Cervical MRI - 04/26/22:  1. Moderate right foraminal stenosis at C4-5 and C5-6 secondary to a rightward disc osteophyte complex and uncovertebral spurring at both levels.  2. Mild left foraminal narrowing at C5-6.  3. Mild uncovertebral spurring at C3-4 without significant stenosis.  PATIENT SURVEYS:  Modified Oswestry = 29, 58% disability  FOTO Lumbar = 42, predicted = 52  SCREENING FOR RED FLAGS: Bowel or bladder incontinence: Yes: stress incontinence over the past week Spinal tumors: No Cauda equina syndrome: No Compression fracture: No Abdominal aneurysm: No  COGNITION:  Overall cognitive status: Within functional limits for tasks assessed     SENSATION: Hot/Cold: Impaired  and delayed processing of hot Intermittent numbness in toes and bottom of feet  MUSCLE LENGTH: Hamstrings: mod tight B Piriformis: mild/mod tight L>R Quad/hip flexors: mod tight R>L ITB: mod tight R, mild tight L  POSTURE:  Lumbar spine WNL in standing but forward head and rounded shoulders with increased thoracic kyphosis  PALPATION: 03/31/22 - (+) TTP over R piriformis > glutes; (-) TTP in lumbar paraspinals 05/08/22 - (+) TTP and increased muscle tension in B cervical paraspinals, R>L UT, LS and periscapular muscles  LUMBAR ROM:   Active  A/PROM  Eval - 03/31/2022  Flexion Hands to ankles - increased pain  Extension Mildly limited - decreases LBP/buttock pain but increases neck pain  Right lateral flexion Fingertips to lateral joint line - tight  Left lateral flexion Fingertips to lateral joint line - tight  Right rotation 50% limited - pain in R buttock  Left rotation 40% limited   (Blank rows = not tested)  LE MMT:  MMT Right 03/31/22 Left 03/31/22  Hip flexion 4 4+  Hip extension 4 4+  Hip abduction 4 4  Hip adduction 4 * 4+  Hip internal rotation 4 * 4+  Hip external rotation 4 * 4+  Knee flexion 4 4+  Knee extension 4+ 5  Ankle dorsiflexion 4- 4  Ankle plantarflexion     Ankle inversion    Ankle eversion     (Blank rows = not tested)  LUMBAR SPECIAL TESTS:  Straight leg raise test: Negative and Slump test: Positive  CERVICAL ROM:   Active ROM AROM (deg) 05/08/22  Flexion 40 - tight  Extension  33 - pain in R shoulder  Right lateral flexion 27  Left lateral flexion 19  Right rotation 51  Left rotation 47   (Blank rows = not tested)  UPPER EXTREMITY ROM:  Active ROM Right 05/08/22 Left 05/08/22  Shoulder flexion 141 145  Shoulder extension 34 35  Shoulder abduction 150 149  Shoulder adduction    Shoulder internal rotation    Shoulder external rotation FER to T3 FER to T3  Elbow flexion FIR to T9 FIR to T9  Elbow extension    Wrist flexion    Wrist extension    Wrist ulnar deviation    Wrist radial deviation    Wrist pronation    Wrist supination     (Blank rows = not tested)  UPPER EXTREMITY MMT:  MMT Right 05/08/22 Left 05/08/22  Shoulder flexion 4 4+  Shoulder extension    Shoulder abduction 4 4  Shoulder adduction    Shoulder extension 4 4  Shoulder internal rotation 4+ 4+  Shoulder external rotation 4 4  Middle trapezius 4 4  Lower trapezius 4 4  Elbow flexion    Elbow extension    Wrist flexion    Wrist extension    Wrist ulnar deviation    Wrist radial deviation    Wrist pronation    Wrist supination    Grip strength 29 lbs 34.67 lbs   (Blank rows = not tested)  CERVICAL SPECIAL TESTS:  Spurling's test: Negative and Distraction test: Negative   TODAY'S TREATMENT   05/08/22 Re-Eval for cervical radiculopathy THERAPEUTIC EXERCISE: to improve flexibility, strength and mobility.  Verbal and tactile cues throughout for technique. Hooklying open book chest stretch 2 x 30 sec - 1st rep in horiz ABD, 2nd rep in horiz ABD + ER Seated chin tuck with neck elongation 10 x 5" Seated thoracic lumbar extension with pectoralis stretch  10 x 5" 3-way doorway pec stretch at 60/90/120 elevation  2 x 30 sec  each   05/06/22 THERAPEUTIC EXERCISE: to improve flexibility, strength and mobility.  Verbal and tactile cues throughout for technique. Rec Bike - L2 x 6 min Hookying R/L hamstring stretch with strap 2 x 30 sec, + DF on 2nd rep for calf stretch Supine R/L ITB stretch cross body with strap 2 x 30 sec Mod thomas R/L hip flexor/quad stretch with strap 2 x 30 sec Hookying R/L figure-4 piriformis stretch 2 x 30 sec - better stretch on L Hookying R/L KTOS piriformis stretch 2 x 30 sec - pt preferring over figure-4 on R Abdominal bracing/PPT 10 x 5"   03/31/22 Brief review of current self-created HEP - pt encouraged to focus on stretching with neutral spine and extension exercises avoiding sit-ups    PATIENT EDUCATION:  Education details: PT eval findings, anticipated POC, and HEP update - initial cervical HEP   Person educated: Patient Education method: Explanation, Demonstration, Tactile cues, and Verbal cues Education comprehension: verbalized understanding, returned demonstration, verbal cues required, and needs further education   HOME EXERCISE PROGRAM: Access Code: [email or web address] (texted to patient on 05/06/22) URL: [email or web address] Date: 05/08/2022 Prepared by: Annie Paras  Exercises - Supine Hamstring Stretch with Strap  - 2 x daily - 7 x weekly - 3 reps - 30 sec hold - Supine ITB Stretch with Strap  - 2 x daily - 7 x weekly - 3 reps - 30 sec hold - Supine Quadriceps Stretch with Strap on Table  - 2 x daily - 7 x weekly - 3 reps -  30 sec hold - Supine Figure 4 Piriformis Stretch  - 2 x daily - 7 x weekly - 3 reps - 30 sec hold - Supine Piriformis Stretch with Foot on Ground  - 2 x daily - 7 x weekly - 3 reps - 30 sec hold - Supine Posterior Pelvic Tilt  - 2 x daily - 7 x weekly - 2 sets - 10 reps - 5 sec hold - Open Book Chest Stretch on Towel Roll  - 2 x daily - 7 x weekly - 3 reps - 30 sec hold - Seated Chin Tuck with Neck Elongation  - 2 x daily - 7 x weekly - 2  sets - 10 reps - 5 sec hold - Seated Thoracic Lumbar Extension with Pectoralis Stretch  - 2 x daily - 7 x weekly - 2 sets - 10 reps - 5-10 sec hold - Doorway Pec Stretch at 60 Elevation  - 2 x daily - 7 x weekly - 3 reps - 30 sec hold - Doorway Pec Stretch at 90 Degrees Abduction  - 2 x daily - 7 x weekly - 3 reps - 30 sec hold - Doorway Pec Stretch at 120 Degrees Abduction  - 2 x daily - 7 x weekly - 3 reps - 30 sec hold  Patient Education - Posture and Body Mechanics   ASSESSMENT:  CLINICAL IMPRESSION: Re-eval completed for new referral to eval and treat for cervical radiculopathy. Virlee reports onset of cervical pain and radiculopathy more recent than LBP/lumbar radiculopathy, mainly over the past 6 months and potentially secondary to compensations for LBP. 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 Cervical related deficits include R sided neck and shoulder/periscapular pain and stiffness with limited cervical ROM, forward head and rounded shoulder posture with increased thoracic kyphosis and R shoulder more protracted than L, and increased muscle tension and ttp in R>L UT, LS, cervicothoracic paraspinals and medial scapular muscles, and R>L shoulder and grip weakness in presence of R hand dominance. Mertis will benefit from skilled PT to address above deficits, promote neutral spinal posture and restore functional cervical ROM with decreased pain and muscle tightness to restore improved quality of life with decreased pain interference. HEP updated to include initial postural stretching exercises.  OBJECTIVE IMPAIRMENTS decreased activity tolerance, decreased knowledge of condition, decreased mobility, difficulty walking, decreased ROM, decreased strength, increased fascial restrictions, impaired perceived functional ability, increased muscle spasms, impaired flexibility, impaired  sensation, impaired UE functional use, improper body mechanics, postural dysfunction, and pain.   ACTIVITY LIMITATIONS meal prep, cleaning, laundry, driving, shopping, community activity, and yard work.   PERSONAL FACTORS Past/current experiences, Time since onset of injury/illness/exacerbation, and 3+ comorbidities: anxiety and depression, endometriosis, cervical dysplasia, IBS with diarrhea, GERD, neck pain, and allergies are also affecting patient's functional outcome.    REHAB POTENTIAL: Good  CLINICAL DECISION MAKING: Evolving/moderate complexity  EVALUATION COMPLEXITY: Moderate   GOALS: Goals reviewed with patient? Yes  SHORT TERM GOALS: Target date: 05/29/2022  Patient will be independent with initial HEP.  Baseline: Issabelle has been performing self-created exercise program but no formal HEP. Goal status: IN PROGRESS  05/06/22 - Pt returning for first time since eval 5 weeks ago, Initial HEP provided  2.  Patient will report centralization of radicular symptoms.  Baseline: Ettie reports near constant R LE radicular pain with intermittent numbness to R toes. Goal status: IN PROGRESS  05/06/22 -  Pt returning for first time since eval 5 weeks ago  3.  Patient will verbalize/demonstrate understanding of neutral spine posture and proper body mechanics to reduce strain on cervical and lumbar spine. Baseline: Lumbar spine WNL in standing but forward head and rounded shoulders Goal status: REVISED  05/06/22 - Pt returning for first time since eval 5 weeks ago   LONG TERM GOALS: Target date: 06/19/2022  Patient will be independent with advanced/ongoing HEP to improve outcomes and carryover.  Baseline: HEP TBD Goal status: IN PROGRESS  05/06/22 - Initial HEP provided  2.  Patient to report reduction in frequency and intensity of LBP by >/= 50-75% to allow for improved activity tolerance Baseline: Near constant LBP at >/= 3/10 Goal status: IN PROGRESS  3.  Patient will demonstrate  full pain free lumbar ROM to perform ADLs.  Baseline: Limited lumbar ROM with pain in flexion and R rotation Goal status: IN PROGRESS   4.  Patient will report 54 on lumbar FOTO to demonstrate improved functional ability.  Baseline: 42 Goal status: IN PROGRESS  5.  Patient will demonstrate improved B LE strength to >/= 4+/5 for improved stability and ease of mobility  Baseline: Refer to above LE MMT chart Goal status: IN PROGRESS  6.  Improve understanding of body mechanics and spine care with patient to verbalize and demo correct lifting techniques Baseline:  Goal status: IN PROGRESS   7. Patient to report ability to perform ADLs, household, and leisure activities without limitation due to neck pain, LBP or R UE/LE radicular pain, LOM or weakness Baseline: Pain is limiting her ability to perform ADLs, household chores, yardwork, and exercise Goal status: REVISED  8.  Patient will report 75% improvement in neck pain to improve QOL.  Baseline: 5-6/10 Goal status: INITIAL  9.  Patient will demonstrate full pain free cervical ROM for safety with driving.  Baseline: Refer to above cervical ROM chart Goal status: INITIAL  10.  Patient will demonstrate improved B shoulder strength to >/= 4+/5 for functional UE use Baseline: Refer to above UE MMT chart Goal status: INITIAL  11.  Patient will demonstrate improved R grip strength to >/= L hand for improved functional use of hand. Baseline: R = 29#, L = 34.67# Goal status: INITIAL  12.  Patient to demonstrate appropriate posture and body mechanics needed for daily activities.   Baseline:  Goal status: INITIAL    PLAN: PT FREQUENCY: 2x/week  PT DURATION: 6 weeks  PLANNED INTERVENTIONS: Therapeutic exercises, Therapeutic activity, Neuromuscular re-education, Balance training, Gait training, Patient/Family education, Joint mobilization, Dry Needling, Electrical stimulation, Spinal mobilization, Cryotherapy, Moist heat, Taping,  Traction, Ultrasound, Ionotophoresis 91m/ml Dexamethasone, Manual therapy, and Re-evaluation.  PLAN FOR NEXT SESSION: review of initial HEP with modifications of LE stretches to options w/o strap as needed; progress postural and lumbopelvic flexibility and strengthening with extension bias; MT +/- DN to address abnormal muscle tension as indicated; modalities including possible estim or mechanical traction PRN   JPercival Spanish PT 05/08/2022, 8:22 PM   PHYSICAL THERAPY DISCHARGE SUMMARY  Visits from Start of Care: 3  Current functional level related to goals / functional outcomes:   Refer to above clinical impression and goal assessment for status as of last visit on 05/08/22. Patient cancelled next scheduled visit and failed to schedule any further visits as requested. She has not returned to PT in >30 days, therefore will proceed with discharge from PT for this episode.     Remaining deficits:  As above. Unable to formally assess lumbar radiculopathy concerns as pt failed to return to PT. Cervical radiculopathy only seen for initial assessment.   Education / Equipment:   HEP; Equities trader   Patient agrees to discharge. Patient goals were not met. Patient is being discharged due to not returning since the last visit.  Percival Spanish, PT, MPT 07/11/22, 9:03 AM  [facility] [address]  [place], [place], [address] Phone: [phone]   Fax:  [phone]

## 2022-05-13 ENCOUNTER — Ambulatory Visit: Payer: Medicaid Other | Admitting: Physical Therapy

## 2022-05-13 ENCOUNTER — Encounter: Payer: Self-pay | Admitting: Physical Therapy

## 2022-05-28 ENCOUNTER — Ambulatory Visit: Payer: Medicaid Other | Admitting: Family

## 2022-05-28 ENCOUNTER — Encounter: Payer: Self-pay | Admitting: Family

## 2022-05-28 VITALS — BP 107/85 | HR 66 | Temp 98.2°F | Resp 16 | Ht 66.0 in | Wt 177.0 lb

## 2022-05-28 DIAGNOSIS — K580 Irritable bowel syndrome with diarrhea: Secondary | ICD-10-CM | POA: Diagnosis not present

## 2022-05-28 DIAGNOSIS — R232 Flushing: Secondary | ICD-10-CM | POA: Diagnosis not present

## 2022-05-28 DIAGNOSIS — R32 Unspecified urinary incontinence: Secondary | ICD-10-CM | POA: Diagnosis not present

## 2022-05-28 MED ORDER — CITALOPRAM HYDROBROMIDE 20 MG PO TABS: ORAL_TABLET | ORAL | 1 refills | Status: DC

## 2022-05-28 NOTE — Assessment & Plan Note (Signed)
Will obtain urine culture. If negative for UTI, consider trial of OAB medication.

## 2022-05-28 NOTE — Progress Notes (Signed)
Subjective:   By signing my name below, I, Vickey Sages, attest that this documentation has been prepared under the direction and in the presence of Sandford Craze, NP 05/28/2022.   Patient ID: Kristen Gibbs, female    DOB: Apr 18, 1981, 41 y.o.   MRN: [id number]  Chief Complaint  Patient presents with   Transitions Of Care    HPI Patient is in today for a transfer of care.  Urinary Incontinence - She complains of urinary incontinence which appeared about a month ago. She has previously went to a provider but continues to have no relief.   Change in Providers - She switched providers because of insurance and she feels that a woman provider would better suit her.   Back/ Neck pain- She reports of constant neck and back pain. She is seeing a physical therapist to help manage this.  Menopause- She reports seeing a gynecologist who told her that she is experiencing pre menopause. She feels that there could be a hormone issue. She reports having irregular menstrual cycle and more frequent spotting. Her estrogen levels were normal when checked back in March. FSH was elevated.   Hot flash treatment- She states that she has worsening hot flashes. She describes symptoms appearing during both the day and night. She reports that she has not taken birth control for 8 years and denies any plans to become pregnant. She prefers not to use birth control as it causes nausea and weight gain. She was previously prescribed Gabapentin and has stopped taking Gabapentin as she finds it ineffective.She is inquiring about OTC medications Jeffie Pollock) to help manage her hot flashes. She is interested in taking Citalopram to help manage her hot flashes.  Wellbutrin- She is currently taking Wellbutrin 75 mg.  Metformin- She takes about one tablet of Metformin because it decreases her hunger. She has been diagnosed with IBS and has had issues her entire life. She states that Metformin worsens her IBS symptoms,  in particular her diarrhea symptoms. She previously went to a Manufacturing engineer and was given a diet but symptoms persisted. She complains that she is having to plan her day around access to the restroom. She states that Loperamide has been ineffective at managing her symptoms. She is inquiring about gallbladder removal as she had an acquaintance mention improving symptoms after the procedure. She notes that she has a family history of Chron's disease but was not diagnosed with it in her teens. She reports that she previously had a colonoscopy. She also reports that she has tried fiber supplements and uses them occasionally. She states that these supplements occasionally relieves her symptoms.   Carafate- She reports that Carafate has brought some relief to her stomach.  Family history- She is not married and has one son. She believes that both her grandparents are living. She also has an adopted brother. She reports a family history of Crohn's disease but has been tested for the disease, which produced negative results.   Social history- She does not drink alcoholic beverages nor consume tobacco and vape products. She does not currently work due to her back pain. She does remain active and also does yard work.   Surgical History - She reports that she has had a Leep procedure.   Past Medical History:  Diagnosis Date   Cervical radiculopathy 03/31/2022   Lumbar radiculopathy 02/24/2022    Past Surgical History:  Procedure Laterality Date   LEEP      Family History  Problem Relation Age of Onset  Hypertension Mother    Hashimoto's thyroiditis Mother    Hypertension Father    Diabetes Maternal Grandmother    Hashimoto's thyroiditis Maternal Aunt    Diabetes Maternal Aunt     Social History   Socioeconomic History   Marital status: Single    Spouse name: Not on file   Number of children: Not on file   Years of education: Not on file   Highest education level: Not on file  Occupational  History   Not on file  Tobacco Use   Smoking status: Former    Packs/day: 0.50    Years: 10.00    Total pack years: 5.00    Types: Cigarettes    Start date: 08/20/2004    Quit date: 08/22/2014    Years since quitting: 7.7   Smokeless tobacco: Never  Vaping Use   Vaping Use: Never used  Substance and Sexual Activity   Alcohol use: Not Currently    Alcohol/week: 1.0 standard drink of alcohol    Types: 1 Cans of beer per week    Comment: soc   Drug use: Never   Sexual activity: Yes    Partners: Male    Birth control/protection: None  Other Topics Concern   Not on file  Social History Narrative   Right handed    Was workng at [employer], quit due to back pain   Stays active with yard work   Single   1 son born 2004   Completed some college    Social Determinants of Corporate investment banker Strain: Not on file  Food Insecurity: Not on file  Transportation Needs: Not on file  Physical Activity: Not on file  Stress: Not on file  Social Connections: Not on file  Intimate Partner Violence: Not on file    Outpatient Medications Prior to Visit  Medication Sig Dispense Refill   buPROPion (WELLBUTRIN) 75 MG tablet Take 1 tablet (75 mg total) by mouth 2 (two) times daily. 60 tablet 3   cetirizine (ZYRTEC) 10 MG tablet Take 1 tablet (10 mg total) by mouth daily. 30 tablet 3   metFORMIN (GLUCOPHAGE) 500 MG tablet TAKE 1 TABLET BY MOUTH 2 TIMES DAILY WITH A MEAL. 180 tablet 1   acetaminophen (TYLENOL) 500 MG tablet Take 500 mg by mouth every 6 (six) hours as needed.     diazepam (VALIUM) 5 MG tablet Please take 30-45 minutes prior to procedure. May repeat x 1. 2 tablet 0   gabapentin (NEURONTIN) 300 MG capsule Take 1 capsule (300 mg total) by mouth at bedtime. 30 capsule 5   imiquimod (ALDARA) 5 % cream Apply topically 3 (three) times a week. 12 each 3   naproxen (NAPROSYN) 500 MG tablet Take 500 mg by mouth 2 (two) times daily with a meal.     No facility-administered medications  prior to visit.    No Known Allergies  ROS See HPI    Objective:    Physical Exam Constitutional:      General: She is not in acute distress.    Appearance: Normal appearance. She is not ill-appearing.  HENT:     Head: Normocephalic and atraumatic.     Right Ear: Tympanic membrane, ear canal and external ear normal.     Left Ear: Tympanic membrane, ear canal and external ear normal.  Eyes:     Extraocular Movements: Extraocular movements intact.     Pupils: Pupils are equal, round, and reactive to light.  Neck:  Thyroid: No thyromegaly.  Cardiovascular:     Rate and Rhythm: Normal rate and regular rhythm.     Pulses: Normal pulses.     Heart sounds: Normal heart sounds. No murmur heard.    No gallop.  Pulmonary:     Effort: Pulmonary effort is normal. No respiratory distress.     Breath sounds: Normal breath sounds. No wheezing or rales.  Abdominal:     General: Bowel sounds are normal.     Palpations: Abdomen is soft.     Tenderness: There is no abdominal tenderness. There is no guarding.  Lymphadenopathy:     Cervical: No cervical adenopathy.  Skin:    General: Skin is warm and dry.  Neurological:     Mental Status: She is alert and oriented to person, place, and time.  Psychiatric:        Mood and Affect: Mood normal.        Behavior: Behavior normal.        Judgment: Judgment normal.     BP 107/85 (BP Location: Left Arm, Patient Position: Sitting, Cuff Size: Small)   Pulse 66   Temp 98.2 F (36.8 C) (Oral)   Resp 16   Ht 5\' 6"  (1.676 m)   Wt 177 lb (80.3 kg)   SpO2 100%   BMI 28.57 kg/m  Wt Readings from Last 3 Encounters:  05/28/22 177 lb (80.3 kg)  03/31/22 180 lb (81.6 kg)  03/06/22 180 lb (81.6 kg)      Assessment & Plan:   Problem List Items Addressed This Visit       Unprioritized   Urinary incontinence - Primary    Will obtain urine culture. If negative for UTI, consider trial of OAB medication.      Relevant Orders   Urine  Culture   Irritable bowel syndrome with diarrhea    Having chronic diarrhea symptoms.  Only slightly worsened with metformin.  She wants to try to continue metformin as it seems to decrease her appetite.   Has not had much improvement with imodium prn.  Suggested that she add metamucil once daily to see if this helps.  If not, we discussed referral to GI.       Hot flashes    She tried gabapentin HS 300mg  without improvement.  Will give a trial of citalopram 20mg  1/2 tab once daily at bedtime for 1 week, then increase to a full tab once daily on week two.  We did discuss low dose OCP but she declines as she wishes to avoid hormones.       Meds ordered this encounter  Medications   citalopram (CELEXA) 20 MG tablet    Sig: 1/2 tab once daily for 1 week, then increase to a full tab once daily on week two.    Dispense:  30 tablet    Refill:  1    Order Specific Question:   Supervising Provider    Answer:   03/08/22 A [[id number]]    I, , NP, personally preformed the services described in this documentation.  All medical record entries made by the scribe were at my direction and in my presence.  I have reviewed the chart and discharge instructions (if applicable) and agree that the record reflects my personal performance and is accurate and complete. 05/28/2022.  I,Mohammed Iqbal,acting as a Danise Edge for Lemont Fillers, NP.,have documented all relevant documentation on the behalf of 07/29/2022, NP,as directed by  Neurosurgeon  Inda Castle, NP while in the presence of Nance Pear, NP.   Nance Pear, NP

## 2022-05-28 NOTE — Assessment & Plan Note (Signed)
She tried gabapentin HS 300mg  without improvement.  Will give a trial of citalopram 20mg  1/2 tab once daily at bedtime for 1 week, then increase to a full tab once daily on week two.  We did discuss low dose OCP but she declines as she wishes to avoid hormones.

## 2022-05-28 NOTE — Assessment & Plan Note (Signed)
Having chronic diarrhea symptoms.  Only slightly worsened with metformin.  She wants to try to continue metformin as it seems to decrease her appetite.   Has not had much improvement with imodium prn.  Suggested that she add metamucil once daily to see if this helps.  If not, we discussed referral to GI.

## 2022-05-30 ENCOUNTER — Encounter: Payer: Self-pay | Admitting: Family

## 2022-05-30 ENCOUNTER — Telehealth: Payer: Self-pay | Admitting: Family

## 2022-05-30 LAB — URINE CULTURE
MICRO NUMBER:: 13637142
SPECIMEN QUALITY:: ADEQUATE

## 2022-05-30 MED ORDER — FLUCONAZOLE 100 MG PO TABS: ORAL_TABLET | ORAL | 0 refills | Status: DC

## 2022-05-30 NOTE — Telephone Encounter (Signed)
Please advise pt that her urine culture is growing yeast. I would like for her to start diflucan 2 tabs by mouth today, then 1 tab once daily for 4 more days. Let me know if her urinary incontinence symptoms do not improve after treatment.

## 2022-05-30 NOTE — Telephone Encounter (Signed)
Called but no answer, MyChart message sent with this information and advised to call back if any questions.

## 2022-06-11 ENCOUNTER — Encounter: Payer: Self-pay | Admitting: Family

## 2022-06-11 ENCOUNTER — Encounter: Payer: Self-pay | Admitting: Family Medicine

## 2022-06-20 ENCOUNTER — Other Ambulatory Visit: Payer: Self-pay | Admitting: Family

## 2022-06-20 ENCOUNTER — Telehealth: Payer: Self-pay | Admitting: Family

## 2022-06-20 NOTE — Telephone Encounter (Signed)
See mychart.  

## 2022-06-26 ENCOUNTER — Telehealth: Payer: Self-pay | Admitting: Family

## 2022-07-07 ENCOUNTER — Other Ambulatory Visit: Payer: Self-pay | Admitting: Medical

## 2022-07-07 ENCOUNTER — Encounter: Payer: Self-pay | Admitting: Family

## 2022-07-29 ENCOUNTER — Other Ambulatory Visit: Payer: Self-pay | Admitting: Family

## 2022-08-20 ENCOUNTER — Telehealth (INDEPENDENT_AMBULATORY_CARE_PROVIDER_SITE_OTHER): Payer: 59 | Admitting: Family

## 2022-08-20 ENCOUNTER — Encounter: Payer: Self-pay | Admitting: Gastroenterology

## 2022-08-20 ENCOUNTER — Encounter: Payer: Self-pay | Admitting: Family

## 2022-08-20 VITALS — Ht 66.0 in | Wt 175.0 lb

## 2022-08-20 DIAGNOSIS — R232 Flushing: Secondary | ICD-10-CM | POA: Diagnosis not present

## 2022-08-20 DIAGNOSIS — R32 Unspecified urinary incontinence: Secondary | ICD-10-CM | POA: Diagnosis not present

## 2022-08-20 DIAGNOSIS — E663 Overweight: Secondary | ICD-10-CM | POA: Diagnosis not present

## 2022-08-20 DIAGNOSIS — K580 Irritable bowel syndrome with diarrhea: Secondary | ICD-10-CM | POA: Diagnosis not present

## 2022-08-20 DIAGNOSIS — F418 Other specified anxiety disorders: Secondary | ICD-10-CM

## 2022-08-20 DIAGNOSIS — R69 Illness, unspecified: Secondary | ICD-10-CM | POA: Diagnosis not present

## 2022-08-20 MED ORDER — CITALOPRAM HYDROBROMIDE 20 MG PO TABS: 20.0000 mg | ORAL_TABLET | Freq: Every day | ORAL | 0 refills | Status: DC

## 2022-08-20 MED ORDER — CITALOPRAM HYDROBROMIDE 20 MG PO TABS
20.0000 mg | ORAL_TABLET | Freq: Every day | ORAL | 0 refills | Status: DC
Start: 2022-08-20 — End: 2022-08-20

## 2022-08-20 MED ORDER — BUPROPION HCL 75 MG PO TABS: 75.0000 mg | ORAL_TABLET | Freq: Two times a day (BID) | ORAL | 0 refills | Status: DC

## 2022-08-20 MED ORDER — OXYBUTYNIN CHLORIDE ER 5 MG PO TB24
5.0000 mg | ORAL_TABLET | Freq: Every day | ORAL | 5 refills | Status: DC
Start: 2022-08-20 — End: 2023-02-20

## 2022-08-20 NOTE — Assessment & Plan Note (Addendum)
Citalopram helps with daytime hot flashes but still has some in the night. Advised her that she can try adding back in HS gabapentin to see if this is helpful and if so to let me know and I will send additional refills.

## 2022-08-20 NOTE — Progress Notes (Signed)
MyChart Video Visit    Virtual Visit via Video Note   This visit type was conducted due to national recommendations for restrictions regarding the COVID-19 Pandemic (e.g. social distancing) in an effort to limit this patient's exposure and mitigate transmission in our community. This patient is at least at moderate risk for complications without adequate follow up. This format is felt to be most appropriate for this patient at this time. Physical exam was limited by quality of the video and audio technology used for the visit. CMA was able to get the patient set up on a video visit.  Patient location: Home. Patient and provider in visit Provider location: Office  I discussed the limitations of evaluation and management by telemedicine and the availability of in person appointments. The patient expressed understanding and agreed to proceed.  Visit Date: 08/20/2022  Today's healthcare provider: Nance Pear, NP     Subjective:    Patient ID: Kristen Gibbs, female    DOB: 1981/05/31, 41 y.o.   MRN: [id number]  Chief Complaint  Patient presents with   Anxiety   Follow-up    HPI  Patient is a 41 yr old female who presents today for follow up of multiple medical concerns.   Past Medical History:  Diagnosis Date   Cervical radiculopathy 03/31/2022   Lumbar radiculopathy 02/24/2022    Past Surgical History:  Procedure Laterality Date   LEEP      Family History  Problem Relation Age of Onset   Hypertension Mother    Hashimoto's thyroiditis Mother    Hypertension Father    Diabetes Maternal Grandmother    Hashimoto's thyroiditis Maternal Aunt    Diabetes Maternal Aunt     Social History   Socioeconomic History   Marital status: Single    Spouse name: Not on file   Number of children: Not on file   Years of education: Not on file   Highest education level: Not on file  Occupational History   Not on file  Tobacco Use   Smoking status: Former     Packs/day: 0.50    Years: 10.00    Total pack years: 5.00    Types: Cigarettes    Start date: 08/20/2004    Quit date: 08/22/2014    Years since quitting: 8.0   Smokeless tobacco: Never  Vaping Use   Vaping Use: Never used  Substance and Sexual Activity   Alcohol use: Not Currently    Alcohol/week: 1.0 standard drink of alcohol    Types: 1 Cans of beer per week    Comment: soc   Drug use: Never   Sexual activity: Yes    Partners: Male    Birth control/protection: None  Other Topics Concern   Not on file  Social History Narrative   Right handed    Was workng at [employer], quit due to back pain   Stays active with yard work   Single   1 son born 2004   Completed some college    Social Determinants of Radio broadcast assistant Strain: Not on file  Food Insecurity: Not on file  Transportation Needs: Not on file  Physical Activity: Not on file  Stress: Not on file  Social Connections: Not on file  Intimate Partner Violence: Not on file    Outpatient Medications Prior to Visit  Medication Sig Dispense Refill   cetirizine (ZYRTEC) 10 MG tablet TAKE 1 TABLET BY MOUTH EVERY DAY 90 tablet 0   metFORMIN (  GLUCOPHAGE) 500 MG tablet TAKE 1 TABLET BY MOUTH 2 TIMES DAILY WITH A MEAL. 180 tablet 1   buPROPion (WELLBUTRIN) 75 MG tablet Take 1 tablet (75 mg total) by mouth 2 (two) times daily. 180 tablet 0   citalopram (CELEXA) 20 MG tablet Take 1 tablet (20 mg total) by mouth daily. 90 tablet 0   fluconazole (DIFLUCAN) 100 MG tablet Take 2 tabs by mouth today, then one tablet by mouth for 4 more days. 6 tablet 0   No facility-administered medications prior to visit.    No Known Allergies  ROS See HPI    Objective:    Physical Exam  Ht 5\' 6"  (1.676 m)   Wt 175 lb (79.4 kg)   BMI 28.25 kg/m  Wt Readings from Last 3 Encounters:  08/20/22 175 lb (79.4 kg)  05/28/22 177 lb (80.3 kg)  03/31/22 180 lb (81.6 kg)    Gen: Awake, alert, no acute distress Resp: Breathing is even  and non-labored Psych: calm/pleasant demeanor Neuro: Alert and Oriented x 3, + facial symmetry, speech is clear, slightly flattened affect.     Assessment & Plan:   Problem List Items Addressed This Visit       Unprioritized   Urinary incontinence    Unchanged. Trial of oxybutynin.        Relevant Medications   oxybutynin (DITROPAN XL) 5 MG 24 hr tablet   Overweight    BMI 28. She reports that she has had some weight loss on the metformin and it curbs her appetite which she is pleased with. She wishes to continue metformin at this time.       Irritable bowel syndrome with diarrhea - Primary    She tried metamucil daily.  No significant change in her diarrhea except maybe a decrease in the frequency of bowel movements.  She has diarrhea even when she does not take metformin.  She would like to see GI for further evaluation.      Relevant Orders   Ambulatory referral to Gastroenterology   Hot flashes    Citalopram helps with daytime hot flashes but still has some in the night. Advised her that she can try adding back in HS gabapentin to see if this is helpful and if so to let me know and I will send additional refills.       Depression with anxiety    Improved with addition of citalopram. Continue citalopram and wellbutrin.      Relevant Medications   buPROPion (WELLBUTRIN) 75 MG tablet   citalopram (CELEXA) 20 MG tablet    I have discontinued Katheryn Blue's fluconazole, citalopram, citalopram, citalopram, and citalopram. I am also having her start on oxybutynin and citalopram. Additionally, I am having her maintain her metFORMIN, cetirizine, and buPROPion.  Meds ordered this encounter  Medications   DISCONTD: citalopram (CELEXA) 20 MG tablet    Sig: Take 1 tablet (20 mg total) by mouth daily.    Dispense:  90 tablet    Refill:  0   buPROPion (WELLBUTRIN) 75 MG tablet    Sig: Take 1 tablet (75 mg total) by mouth 2 (two) times daily.    Dispense:  180 tablet     Refill:  0   oxybutynin (DITROPAN XL) 5 MG 24 hr tablet    Sig: Take 1 tablet (5 mg total) by mouth at bedtime.    Dispense:  30 tablet    Refill:  5    Order Specific Question:   Supervising  Provider    Answer:   Penni Homans A [[id number]]   DISCONTD: citalopram (CELEXA) 20 MG tablet    Sig: Take 1 tablet (20 mg total) by mouth daily.    Dispense:  90 tablet    Refill:  0    Order Specific Question:   Supervising Provider    Answer:   Penni Homans A [[id number]]   DISCONTD: citalopram (CELEXA) 20 MG tablet    Sig: Take 1 tablet (20 mg total) by mouth daily.    Dispense:  90 tablet    Refill:  0    Order Specific Question:   Supervising Provider    Answer:   Penni Homans A [[id number]]   citalopram (CELEXA) 20 MG tablet    Sig: Take 1 tablet (20 mg total) by mouth daily.    Dispense:  90 tablet    Refill:  0    Order Specific Question:   Supervising Provider    Answer:   Penni Homans A [[id number]]    I discussed the assessment and treatment plan with the patient. The patient was provided an opportunity to ask questions and all were answered. The patient agreed with the plan and demonstrated an understanding of the instructions.   The patient was advised to call back or seek an in-person evaluation if the symptoms worsen or if the condition fails to improve as anticipated.   Nance Pear, NP [employer] [phone] (phone) [phone] (fax)  [place]

## 2022-08-20 NOTE — Assessment & Plan Note (Signed)
Unchanged. Trial of oxybutynin.

## 2022-08-20 NOTE — Assessment & Plan Note (Signed)
Improved with addition of citalopram. Continue citalopram and wellbutrin.

## 2022-08-20 NOTE — Assessment & Plan Note (Addendum)
She tried metamucil daily.  No significant change in her diarrhea except maybe a decrease in the frequency of bowel movements.  She has diarrhea even when she does not take metformin.  She would like to see GI for further evaluation.

## 2022-08-20 NOTE — Assessment & Plan Note (Signed)
BMI 28. She reports that she has had some weight loss on the metformin and it curbs her appetite which she is pleased with. She wishes to continue metformin at this time.

## 2022-08-24 ENCOUNTER — Other Ambulatory Visit: Payer: Self-pay | Admitting: Medical

## 2022-08-27 ENCOUNTER — Ambulatory Visit: Payer: 59 | Admitting: Gastroenterology

## 2022-08-27 NOTE — Progress Notes (Deleted)
Referring Provider: Sandford Craze, NP Primary Care Physician:  Sandford Craze, NP  Reason for Consultation:  ***   IMPRESSION:  ***  PLAN: ***   HPI: Kristen Gibbs is a 41 y.o. female referred by NP Peggyann Juba for further evaluation of IBS with diarrhea.  The history is obtained through the patient and review of her electronic health record.  She has a history of anxiety, cervical radiculopathy, BMI 28, depression, hot flashes and lumbar radiculopathy.   No improvement with daily Metamucil. To rhinorrhea persists even when she does not take metformin.  Recent symptoms of urinary incontinence.  No prior abdominal imaging.  No prior endoscopic evaluation.  There is no known family history of colon cancer or polyps. No family history of stomach cancer or other GI malignancy. No family history of inflammatory bowel disease or celiac.    Past Medical History:  Diagnosis Date   Cervical radiculopathy 03/31/2022   Lumbar radiculopathy 02/24/2022    Past Surgical History:  Procedure Laterality Date   LEEP      Current Outpatient Medications  Medication Sig Dispense Refill   buPROPion (WELLBUTRIN) 75 MG tablet Take 1 tablet (75 mg total) by mouth 2 (two) times daily. 180 tablet 0   cetirizine (ZYRTEC) 10 MG tablet TAKE 1 TABLET BY MOUTH EVERY DAY 90 tablet 0   citalopram (CELEXA) 20 MG tablet Take 1 tablet (20 mg total) by mouth daily. 90 tablet 0   metFORMIN (GLUCOPHAGE) 500 MG tablet TAKE 1 TABLET BY MOUTH TWICE A DAY WITH FOOD 180 tablet 1   oxybutynin (DITROPAN XL) 5 MG 24 hr tablet Take 1 tablet (5 mg total) by mouth at bedtime. 30 tablet 5   No current facility-administered medications for this visit.    Allergies as of 08/27/2022   (No Known Allergies)    Family History  Problem Relation Age of Onset   Hypertension Mother    Hashimoto's thyroiditis Mother    Hypertension Father    Diabetes Maternal Grandmother    Hashimoto's thyroiditis  Maternal Aunt    Diabetes Maternal Aunt     Social History   Socioeconomic History   Marital status: Single    Spouse name: Not on file   Number of children: Not on file   Years of education: Not on file   Highest education level: Not on file  Occupational History   Not on file  Tobacco Use   Smoking status: Former    Packs/day: 0.50    Years: 10.00    Total pack years: 5.00    Types: Cigarettes    Start date: 08/20/2004    Quit date: 08/22/2014    Years since quitting: 8.0   Smokeless tobacco: Never  Vaping Use   Vaping Use: Never used  Substance and Sexual Activity   Alcohol use: Not Currently    Alcohol/week: 1.0 standard drink of alcohol    Types: 1 Cans of beer per week    Comment: soc   Drug use: Never   Sexual activity: Yes    Partners: Male    Birth control/protection: None  Other Topics Concern   Not on file  Social History Narrative   Right handed    Was workng at [employer], quit due to back pain   Stays active with yard work   Single   1 son born 2004   Completed some college    Social Determinants of Corporate investment banker Strain: Not on file  Food Insecurity: Not  on file  Transportation Needs: Not on file  Physical Activity: Not on file  Stress: Not on file  Social Connections: Not on file  Intimate Partner Violence: Not on file    Review of Systems: 12 system ROS is negative except as noted above.   Physical Exam: General:   Alert,  well-nourished, pleasant and cooperative in NAD Head:  Normocephalic and atraumatic. Eyes:  Sclera clear, no icterus.   Conjunctiva pink. Ears:  Normal auditory acuity. Nose:  No deformity, discharge,  or lesions. Mouth:  No deformity or lesions.   Neck:  Supple; no masses or thyromegaly. Lungs:  Clear throughout to auscultation.   No wheezes. Heart:  Regular rate and rhythm; no murmurs. Abdomen:  Soft, nontender, nondistended, normal bowel sounds, no rebound or guarding. No hepatosplenomegaly.   Rectal:   Deferred  Msk:  Symmetrical. No boney deformities LAD: No inguinal or umbilical LAD Extremities:  No clubbing or edema. Neurologic:  Alert and  oriented x4;  grossly nonfocal Skin:  Intact without significant lesions or rashes. Psych:  Alert and cooperative. Normal mood and affect.    Tracie Dore L. Tarri Glenn, MD, MPH 08/27/2022, 1:44 PM

## 2022-09-07 DIAGNOSIS — J00 Acute nasopharyngitis [common cold]: Secondary | ICD-10-CM | POA: Diagnosis not present

## 2022-10-02 ENCOUNTER — Other Ambulatory Visit: Payer: Self-pay | Admitting: Family

## 2022-10-08 ENCOUNTER — Telehealth: Payer: Self-pay | Admitting: *Deleted

## 2022-10-08 DIAGNOSIS — M5416 Radiculopathy, lumbar region: Secondary | ICD-10-CM

## 2022-10-08 NOTE — Telephone Encounter (Signed)
-----   Message from Carl Best sent at 10/07/2022  1:56 PM EST ----- Regarding: Pt says Neck&Back returned wants Epidural & MRI Patient cld says her low back pain has a returned & ask provider to send order to [facility] for another Epidural.  -----Pt also says her Neck pain has worsened & ask if  MRI could be order for her chronic neck pain.  (Pt's Health ins is now [employer] plan(info in her chart updated))

## 2022-10-08 NOTE — Telephone Encounter (Signed)
Epidural order placed.  

## 2022-10-13 ENCOUNTER — Telehealth (INDEPENDENT_AMBULATORY_CARE_PROVIDER_SITE_OTHER): Payer: 59 | Admitting: Family

## 2022-10-13 DIAGNOSIS — K580 Irritable bowel syndrome with diarrhea: Secondary | ICD-10-CM

## 2022-10-13 DIAGNOSIS — E663 Overweight: Secondary | ICD-10-CM | POA: Diagnosis not present

## 2022-10-13 DIAGNOSIS — M5416 Radiculopathy, lumbar region: Secondary | ICD-10-CM

## 2022-10-13 DIAGNOSIS — R69 Illness, unspecified: Secondary | ICD-10-CM | POA: Diagnosis not present

## 2022-10-13 DIAGNOSIS — R232 Flushing: Secondary | ICD-10-CM | POA: Diagnosis not present

## 2022-10-13 DIAGNOSIS — F418 Other specified anxiety disorders: Secondary | ICD-10-CM

## 2022-10-13 DIAGNOSIS — R32 Unspecified urinary incontinence: Secondary | ICD-10-CM

## 2022-10-13 MED ORDER — GABAPENTIN 100 MG PO CAPS: 100.0000 mg | ORAL_CAPSULE | Freq: Every day | ORAL | 1 refills | Status: DC

## 2022-10-13 NOTE — Assessment & Plan Note (Signed)
She plans to reschedule gi consult- I gave her number to call.

## 2022-10-13 NOTE — Progress Notes (Signed)
MyChart Video Visit    Virtual Visit via Video Note   This visit type was conducted due to national recommendations for restrictions regarding the COVID-19 Pandemic (e.g. social distancing) in an effort to limit this patient's exposure and mitigate transmission in our community. This patient is at least at moderate risk for complications without adequate follow up. This format is felt to be most appropriate for this patient at this time. Physical exam was limited by quality of the video and audio technology used for the visit. CMA was able to get the patient set up on a video visit.  Patient location: Home Patient and provider in visit Provider location: Office  I discussed the limitations of evaluation and management by telemedicine and the availability of in person appointments. The patient expressed understanding and agreed to proceed.  Visit Date: 10/13/2022  Today's healthcare provider: Lemont Fillers, NP     Subjective:    Patient ID: Kristen Gibbs, female    DOB: 1981/08/19, 41 y.o.   MRN: [id number]  Chief Complaint  Patient presents with   Weight Management Screening    Patient on Metformin for weight management, will like to change to different medication    HPI Patient is in today for a virtual visit. We started as a video visit but transitioned to a telephone visit due to poor audio.   Urinary Incontinence Patient reports that she has not started Ditropan xl 5 mg medication because she is worried about the side effects.  Weight loss management  Patient reports that she wishes to switch from Metformin 500 mg to a weight loss injectable. She states that she's lost 5 lbs and exercises 3-5 times a week.   Hot flashes Patient reports that she has gotten better with hot flashes, but is still experiencing them. She states that she has not added gabapentin at nighttime and is complaint with 20 mg Celexa.   Anxiety and depression Patient reports that she is in  a stable mood and is doing well with 75 mg Wellbutrin and 20 mg Celexa.   Back pain  Patient reports that she has been experiencing back pain and has an upcoming appointment with a sports medicine specialist.    Past Medical History:  Diagnosis Date   Cervical radiculopathy 03/31/2022   Lumbar radiculopathy 02/24/2022    Past Surgical History:  Procedure Laterality Date   LEEP      Family History  Problem Relation Age of Onset   Hypertension Mother    Hashimoto's thyroiditis Mother    Hypertension Father    Diabetes Maternal Grandmother    Hashimoto's thyroiditis Maternal Aunt    Diabetes Maternal Aunt     Social History   Socioeconomic History   Marital status: Single    Spouse name: Not on file   Number of children: Not on file   Years of education: Not on file   Highest education level: Not on file  Occupational History   Not on file  Tobacco Use   Smoking status: Former    Packs/day: 0.50    Years: 10.00    Total pack years: 5.00    Types: Cigarettes    Start date: 08/20/2004    Quit date: 08/22/2014    Years since quitting: 8.1   Smokeless tobacco: Never  Vaping Use   Vaping Use: Never used  Substance and Sexual Activity   Alcohol use: Not Currently    Alcohol/week: 1.0 standard drink of alcohol    Types:  1 Cans of beer per week    Comment: soc   Drug use: Never   Sexual activity: Yes    Partners: Male    Birth control/protection: None  Other Topics Concern   Not on file  Social History Narrative   Right handed    Was workng at [employer], quit due to back pain   Stays active with yard work   Single   1 son born 2004   Completed some college    Social Determinants of Corporate investment banker Strain: Not on file  Food Insecurity: Not on file  Transportation Needs: Not on file  Physical Activity: Not on file  Stress: Not on file  Social Connections: Not on file  Intimate Partner Violence: Not on file    Outpatient Medications Prior to Visit   Medication Sig Dispense Refill   buPROPion (WELLBUTRIN) 75 MG tablet Take 1 tablet (75 mg total) by mouth 2 (two) times daily. 180 tablet 0   cetirizine (ZYRTEC) 10 MG tablet TAKE 1 TABLET BY MOUTH EVERY DAY 90 tablet 0   citalopram (CELEXA) 20 MG tablet Take 1 tablet (20 mg total) by mouth daily. 90 tablet 0   metFORMIN (GLUCOPHAGE) 500 MG tablet TAKE 1 TABLET BY MOUTH TWICE A DAY WITH FOOD 180 tablet 1   oxybutynin (DITROPAN XL) 5 MG 24 hr tablet Take 1 tablet (5 mg total) by mouth at bedtime. 30 tablet 5   No facility-administered medications prior to visit.    No Known Allergies  ROS See HPI    Objective:    Physical Exam  There were no vitals taken for this visit. Wt Readings from Last 3 Encounters:  08/20/22 175 lb (79.4 kg)  05/28/22 177 lb (80.3 kg)  03/31/22 180 lb (81.6 kg)   Gen: Awake, alert, no acute distress Resp: Breathing is even and non-labored Psych: calm/pleasant demeanor Neuro: Alert and Oriented x 3, + facial symmetry, speech is clear.    Assessment & Plan:   Problem List Items Addressed This Visit       Unprioritized   Urinary incontinence    Picked up ditropan.  Worried about dry mouth side effect.  She will think about trying.       Overweight - Primary    Wt Readings from Last 3 Encounters:  08/20/22 175 lb (79.4 kg)  05/28/22 177 lb (80.3 kg)  03/31/22 180 lb (81.6 kg)  Continues exercising. It does not appear that Reginal Lutes is on her insurance formulary. Will continue metformin. She is only eating 1 meal a day but is having sugar cravings.  Encouraged her to eat 3 meals a day which include fruits/veggies, lean proteins and small portions of complex carbs.       Lumbar radiculopathy    Uncontrolled. Has appointment tomorrow with sports medicine to further discuss.       Relevant Medications   gabapentin (NEURONTIN) 100 MG capsule   Irritable bowel syndrome with diarrhea    She plans to reschedule gi consult- I gave her number to call.        Hot flashes    Fair control- did not restart gabapentin. Will add 100mg  HS.       Depression with anxiety    Stable on citalopram and wellbutrin. Continue same.       22 minutes spent on today's visit.  Meds ordered this encounter  Medications   gabapentin (NEURONTIN) 100 MG capsule    Sig: Take 1 capsule (100  mg total) by mouth at bedtime.    Dispense:  90 capsule    Refill:  1    Order Specific Question:   Supervising Provider    Answer:   Danise Edge A [[id number]]    I discussed the assessment and treatment plan with the patient. The patient was provided an opportunity to ask questions and all were answered. The patient agreed with the plan and demonstrated an understanding of the instructions.   The patient was advised to call back or seek an in-person evaluation if the symptoms worsen or if the condition fails to improve as anticipated.  Lemont Fillers, NP [employer] [facility] [employer] [phone] (phone) [phone] (fax)   Elberta Leatherwood as a scribe for Lemont Fillers, NP.,have documented all relevant documentation on the behalf of Lemont Fillers, NP,as directed by  Lemont Fillers, NP while in the presence of Lemont Fillers, NP.   [facility]

## 2022-10-13 NOTE — Assessment & Plan Note (Signed)
Picked up ditropan.  Worried about dry mouth side effect.  She will think about trying.

## 2022-10-13 NOTE — Assessment & Plan Note (Addendum)
Wt Readings from Last 3 Encounters:  08/20/22 175 lb (79.4 kg)  05/28/22 177 lb (80.3 kg)  03/31/22 180 lb (81.6 kg)   Continues exercising. It does not appear that Reginal Lutes is on her insurance formulary. Will continue metformin. She is only eating 1 meal a day but is having sugar cravings.  Encouraged her to eat 3 meals a day which include fruits/veggies, lean proteins and small portions of complex carbs.

## 2022-10-13 NOTE — Assessment & Plan Note (Signed)
Stable on citalopram and wellbutrin. Continue same.

## 2022-10-13 NOTE — Assessment & Plan Note (Signed)
Uncontrolled. Has appointment tomorrow with sports medicine to further discuss.

## 2022-10-13 NOTE — Assessment & Plan Note (Addendum)
Fair control- did not restart gabapentin. Will add 100mg  HS.

## 2022-10-14 ENCOUNTER — Encounter: Payer: Self-pay | Admitting: Family Medicine

## 2022-10-14 ENCOUNTER — Telehealth (INDEPENDENT_AMBULATORY_CARE_PROVIDER_SITE_OTHER): Payer: 59 | Admitting: Family Medicine

## 2022-10-14 DIAGNOSIS — M5416 Radiculopathy, lumbar region: Secondary | ICD-10-CM | POA: Diagnosis not present

## 2022-10-14 MED ORDER — LIDOCAINE 5 % EX PTCH: 1.0000 | MEDICATED_PATCH | Freq: Two times a day (BID) | CUTANEOUS | 2 refills | Status: AC

## 2022-10-14 NOTE — Assessment & Plan Note (Addendum)
Acutely occurring. Previous MRI has shown disc protrusion.  Did well with her previous epidural injection.  - counseled on home exercise therapy and supportive care - epidural

## 2022-10-14 NOTE — Addendum Note (Signed)
Addended by: Myra Rude on: 10/14/2022 04:55 PM   Modules accepted: Orders

## 2022-10-14 NOTE — Progress Notes (Signed)
Virtual Visit via Video Note  I connected with Kristen Gibbs on 10/14/22 at  1:30 PM EST by a video enabled telemedicine application and verified that I am speaking with the correct person using two identifiers.  Location: Patient: home Provider: office   I discussed the limitations of evaluation and management by telemedicine and the availability of in person appointments. The patient expressed understanding and agreed to proceed.  History of Present Illness:  Ms. Kristen Gibbs is a 41 yo F that is presenting with acute worsening of her radicular lower leg pain and right sided neck pain. Received an epidural earlier this year that did help with her radicular leg pain. Her pain was recently exacerbated over the past few weeks.    Observations/Objective:   Assessment and Plan:  Lumbar radiculopathy:  Acutely occurring. Previous MRI has shown disc protrusion.  Did well with her previous epidural injection.  - counseled on home exercise therapy and supportive care - epidural   Follow Up Instructions:    I discussed the assessment and treatment plan with the patient. The patient was provided an opportunity to ask questions and all were answered. The patient agreed with the plan and demonstrated an understanding of the instructions.   The patient was advised to call back or seek an in-person evaluation if the symptoms worsen or if the condition fails to improve as anticipated.    Clare Gandy, MD

## 2022-10-15 DIAGNOSIS — R69 Illness, unspecified: Secondary | ICD-10-CM | POA: Diagnosis not present

## 2022-10-15 DIAGNOSIS — Z833 Family history of diabetes mellitus: Secondary | ICD-10-CM | POA: Diagnosis not present

## 2022-10-15 DIAGNOSIS — K219 Gastro-esophageal reflux disease without esophagitis: Secondary | ICD-10-CM | POA: Diagnosis not present

## 2022-10-15 DIAGNOSIS — G8929 Other chronic pain: Secondary | ICD-10-CM | POA: Diagnosis not present

## 2022-10-15 DIAGNOSIS — Z87891 Personal history of nicotine dependence: Secondary | ICD-10-CM | POA: Diagnosis not present

## 2022-10-15 DIAGNOSIS — Z7984 Long term (current) use of oral hypoglycemic drugs: Secondary | ICD-10-CM | POA: Diagnosis not present

## 2022-10-15 DIAGNOSIS — R634 Abnormal weight loss: Secondary | ICD-10-CM | POA: Diagnosis not present

## 2022-10-15 DIAGNOSIS — Z8249 Family history of ischemic heart disease and other diseases of the circulatory system: Secondary | ICD-10-CM | POA: Diagnosis not present

## 2022-10-15 DIAGNOSIS — R32 Unspecified urinary incontinence: Secondary | ICD-10-CM | POA: Diagnosis not present

## 2022-10-19 ENCOUNTER — Other Ambulatory Visit: Payer: Self-pay | Admitting: Family Medicine

## 2022-10-20 ENCOUNTER — Other Ambulatory Visit: Payer: Self-pay | Admitting: Family Medicine

## 2022-10-20 ENCOUNTER — Ambulatory Visit
Admission: RE | Admit: 2022-10-20 | Discharge: 2022-10-20 | Disposition: A | Discharge status: Home / Self Care | Payer: 59 | Source: Ambulatory Visit | Attending: Family Medicine | Admitting: Family Medicine

## 2022-10-20 DIAGNOSIS — M5416 Radiculopathy, lumbar region: Secondary | ICD-10-CM

## 2022-10-20 MED ORDER — METHYLPREDNISOLONE ACETATE 40 MG/ML INJ SUSP (RADIOLOG
80.0000 mg | Freq: Once | INTRAMUSCULAR | Status: AC
Start: 2022-10-20 — End: 2022-10-20
  Administered 2022-10-20: 80 mg via EPIDURAL

## 2022-10-20 MED ORDER — IOPAMIDOL (ISOVUE-M 200) INJECTION 41%
1.0000 mL | Freq: Once | INTRAMUSCULAR | Status: AC
Start: 2022-10-20 — End: 2022-10-20
  Administered 2022-10-20: 1 mL via EPIDURAL

## 2022-10-20 NOTE — Discharge Instructions (Signed)

## 2022-10-31 ENCOUNTER — Telehealth: Payer: 59 | Admitting: Emergency Medicine

## 2022-10-31 DIAGNOSIS — R3989 Other symptoms and signs involving the genitourinary system: Secondary | ICD-10-CM

## 2022-10-31 MED ORDER — CEPHALEXIN 500 MG PO CAPS: 500.0000 mg | ORAL_CAPSULE | Freq: Two times a day (BID) | ORAL | 0 refills | Status: AC

## 2022-10-31 NOTE — Progress Notes (Signed)
E-Visit for Urinary Problems ? ?We are sorry that you are not feeling well.  Here is how we plan to help! ? ?Based on what you shared with me it looks like you most likely have a simple urinary tract infection. ? ?A UTI (Urinary Tract Infection) is a bacterial infection of the bladder. ? ?Most cases of urinary tract infections are simple to treat but a key part of your care is to encourage you to drink plenty of fluids and watch your symptoms carefully. ? ?I have prescribed Keflex 500 mg twice a day for 7 days.  Your symptoms should gradually improve. Call us if the burning in your urine worsens, you develop worsening fever, back pain or pelvic pain or if your symptoms do not resolve after completing the antibiotic. ? ?Urinary tract infections can be prevented by drinking plenty of water to keep your body hydrated.  Also be sure when you wipe, wipe from front to back and don't hold it in!  If possible, empty your bladder every 4 hours. ? ?HOME CARE ?Drink plenty of fluids ?Compete the full course of the antibiotics even if the symptoms resolve ?Remember, when you need to go?go. Holding in your urine can increase the likelihood of getting a UTI! ?GET HELP RIGHT AWAY IF: ?You cannot urinate ?You get a high fever ?Worsening back pain occurs ?You see blood in your urine ?You feel sick to your stomach or throw up ?You feel like you are going to pass out ? ?MAKE SURE YOU  ?Understand these instructions. ?Will watch your condition. ?Will get help right away if you are not doing well or get worse. ? ? ?Thank you for choosing an e-visit. ? ?Your e-visit answers were reviewed by a board certified advanced clinical practitioner to complete your personal care plan. Depending upon the condition, your plan could have included both over the counter or prescription medications. ? ?Please review your pharmacy choice. Make sure the pharmacy is open so you can pick up prescription now. If there is a problem, you may contact your  provider through MyChart messaging and have the prescription routed to another pharmacy.  Your safety is important to us. If you have drug allergies check your prescription carefully.  ? ?For the next 24 hours you can use MyChart to ask questions about today's visit, request a non-urgent call back, or ask for a work or school excuse. ?You will get an email in the next two days asking about your experience. I hope that your e-visit has been valuable and will speed your recovery. ? ?I have spent 5 minutes in review of e-visit questionnaire, review and updating patient chart, medical decision making and response to patient.  ? ?Wauneta Silveria, PhD, FNP-BC ?  ?

## 2022-11-03 ENCOUNTER — Encounter: Payer: Self-pay | Admitting: Family

## 2022-11-05 ENCOUNTER — Telehealth: Payer: 59 | Admitting: Family

## 2022-11-05 DIAGNOSIS — R6889 Other general symptoms and signs: Secondary | ICD-10-CM

## 2022-11-05 MED ORDER — OSELTAMIVIR PHOSPHATE 75 MG PO CAPS: 75.0000 mg | ORAL_CAPSULE | Freq: Two times a day (BID) | ORAL | 0 refills | Status: DC

## 2022-11-05 NOTE — Progress Notes (Signed)

## 2022-11-27 ENCOUNTER — Other Ambulatory Visit: Payer: Self-pay | Admitting: Family

## 2022-12-20 ENCOUNTER — Other Ambulatory Visit: Payer: Self-pay | Admitting: Family

## 2023-01-06 ENCOUNTER — Telehealth: Payer: 59 | Admitting: Family Medicine

## 2023-01-06 DIAGNOSIS — J069 Acute upper respiratory infection, unspecified: Secondary | ICD-10-CM | POA: Diagnosis not present

## 2023-01-06 MED ORDER — FLUTICASONE PROPIONATE 50 MCG/ACT NA SUSP: 2.0000 | Freq: Every day | NASAL | 0 refills | Status: AC

## 2023-01-06 MED ORDER — BENZONATATE 100 MG PO CAPS: 100.0000 mg | ORAL_CAPSULE | Freq: Three times a day (TID) | ORAL | 0 refills | Status: DC | PRN

## 2023-01-06 NOTE — Progress Notes (Signed)

## 2023-01-07 ENCOUNTER — Telehealth: Payer: 59 | Admitting: Nurse Practitioner

## 2023-01-07 DIAGNOSIS — J0140 Acute pansinusitis, unspecified: Secondary | ICD-10-CM | POA: Diagnosis not present

## 2023-01-07 MED ORDER — AMOXICILLIN-POT CLAVULANATE 875-125 MG PO TABS: 1.0000 | ORAL_TABLET | Freq: Two times a day (BID) | ORAL | 0 refills | Status: AC

## 2023-01-07 NOTE — Progress Notes (Signed)
Virtual Visit Consent   Kristen Gibbs, you are scheduled for a virtual visit with a [facility] provider today. Just as with appointments in the office, your consent must be obtained to participate. Your consent will be active for this visit and any virtual visit you may have with one of our providers in the next 365 days. If you have a MyChart account, a copy of this consent can be sent to you electronically.  As this is a virtual visit, video technology does not allow for your provider to perform a traditional examination. This may limit your provider's ability to fully assess your condition. If your provider identifies any concerns that need to be evaluated in person or the need to arrange testing (such as labs, EKG, etc.), we will make arrangements to do so. Although advances in technology are sophisticated, we cannot ensure that it will always work on either your end or our end. If the connection with a video visit is poor, the visit may have to be switched to a telephone visit. With either a video or telephone visit, we are not always able to ensure that we have a secure connection.  By engaging in this virtual visit, you consent to the provision of healthcare and authorize for your insurance to be billed (if applicable) for the services provided during this visit. Depending on your insurance coverage, you may receive a charge related to this service.  I need to obtain your verbal consent now. Are you willing to proceed with your visit today? [name] has provided verbal consent on 01/07/2023 for a virtual visit (video or telephone). Apolonio Schneiders, FNP  Date: 01/07/2023 7:51 AM  Virtual Visit via Video Note   I, Apolonio Schneiders, connected with  Kristen Gibbs  ([id number], 24-May-1981) on 01/07/23 at  8:00 AM EST by a video-enabled telemedicine application and verified that I am speaking with the correct person using two identifiers.  Location: Patient: Virtual Visit Location Patient:  Home Provider: Virtual Visit Location Provider: Home Office   I discussed the limitations of evaluation and management by telemedicine and the availability of in person appointments. The patient expressed understanding and agreed to proceed.    History of Present Illness: Kristen Gibbs is a 42 y.o. who identifies as a female who was assigned female at birth, and is being seen today for sinus congestion She has been feeling unwell for the past week with sinus congestion no fever no chills no cough at this point   She did have an e-visit a few days ago and was given cough medication and a nasal spray that has not provided relief.    She takes Zyrtec daily for allergies  Has been using Dayquil and Nyquil for relief   She does have a history of asthma and feels that her breathing worsens during allergy season. Denies chest congestion at this point, she does have post nasal drainage no wheezing   Denies sick contacts   Problems:  Patient Active Problem List   Diagnosis Date Noted   Depression with anxiety 08/20/2022   Overweight 08/20/2022   Hot flashes 05/28/2022   Urinary incontinence 05/28/2022   Irritable bowel syndrome with diarrhea 05/28/2022   Cervical radiculopathy 03/31/2022   Lumbar radiculopathy 02/24/2022    Allergies: No Known Allergies  Medications:  Current Outpatient Medications:    benzonatate (TESSALON) 100 MG capsule, Take 1 capsule (100 mg total) by mouth 3 (three) times daily as needed., Disp: 30 capsule, Rfl: 0   buPROPion (WELLBUTRIN)  75 MG tablet, TAKE 1 TABLET BY MOUTH TWICE A DAY, Disp: 180 tablet, Rfl: 1   cetirizine (ZYRTEC) 10 MG tablet, TAKE 1 TABLET BY MOUTH EVERY DAY, Disp: 90 tablet, Rfl: 0   citalopram (CELEXA) 20 MG tablet, Take 1 tablet (20 mg total) by mouth daily., Disp: 90 tablet, Rfl: 0   diazepam (VALIUM) 5 MG tablet, PLEASE TAKE 30-45 MINUTES PRIOR TO PROCEDURE. MAY REPEAT X 1., Disp: 2 tablet, Rfl: 0   fluticasone (FLONASE) 50 MCG/ACT  nasal spray, Place 2 sprays into both nostrils daily., Disp: 16 g, Rfl: 0   gabapentin (NEURONTIN) 100 MG capsule, Take 1 capsule (100 mg total) by mouth at bedtime., Disp: 90 capsule, Rfl: 1   lidocaine (LIDODERM) 5 %, Place 1 patch onto the skin every 12 (twelve) hours. Remove & Discard patch within 12 hours or as directed by MD, Disp: 30 patch, Rfl: 2   metFORMIN (GLUCOPHAGE) 500 MG tablet, TAKE 1 TABLET BY MOUTH TWICE A DAY WITH FOOD, Disp: 180 tablet, Rfl: 1   oseltamivir (TAMIFLU) 75 MG capsule, Take 1 capsule (75 mg total) by mouth 2 (two) times daily., Disp: 10 capsule, Rfl: 0   oxybutynin (DITROPAN XL) 5 MG 24 hr tablet, Take 1 tablet (5 mg total) by mouth at bedtime., Disp: 30 tablet, Rfl: 5  Observations/Objective: Patient is well-developed, well-nourished in no acute distress.  Resting comfortably  at home.  Head is normocephalic, atraumatic.  No labored breathing.  Speech is clear and coherent with logical content.  Patient is alert and oriented at baseline.    Assessment and Plan: 1. Acute non-recurrent pansinusitis  Continue allergy regimen and start antibiotic as discussed   - amoxicillin-clavulanate (AUGMENTIN) 875-125 MG tablet; Take 1 tablet by mouth 2 (two) times daily for 7 days. Take with food  Dispense: 14 tablet; Refill: 0    Follow Up Instructions: I discussed the assessment and treatment plan with the patient. The patient was provided an opportunity to ask questions and all were answered. The patient agreed with the plan and demonstrated an understanding of the instructions.  A copy of instructions were sent to the patient via MyChart unless otherwise noted below.    The patient was advised to call back or seek an in-person evaluation if the symptoms worsen or if the condition fails to improve as anticipated.  Time:  I spent 9 minutes with the patient via telehealth technology discussing the above problems/concerns.    Apolonio Schneiders, FNP

## 2023-02-20 ENCOUNTER — Encounter: Payer: Self-pay | Admitting: Family Medicine

## 2023-02-20 ENCOUNTER — Telehealth (INDEPENDENT_AMBULATORY_CARE_PROVIDER_SITE_OTHER): Payer: 59 | Admitting: Family Medicine

## 2023-02-20 VITALS — Ht 66.0 in | Wt 180.0 lb

## 2023-02-20 DIAGNOSIS — M5416 Radiculopathy, lumbar region: Secondary | ICD-10-CM

## 2023-02-20 DIAGNOSIS — M5412 Radiculopathy, cervical region: Secondary | ICD-10-CM | POA: Diagnosis not present

## 2023-02-20 MED ORDER — GABAPENTIN 300 MG PO CAPS: 300.0000 mg | ORAL_CAPSULE | Freq: Three times a day (TID) | ORAL | 1 refills | Status: DC

## 2023-02-20 NOTE — Assessment & Plan Note (Signed)
Acute on chronic in nature.  Having pain that is similar to her previous type radicular pain -Counseled on home exercise therapy and supportive care. -Could consider epidural

## 2023-02-20 NOTE — Assessment & Plan Note (Signed)
Acute on chronic in nature.  Previous MRI is demonstrating an area of impingement.   - Counseled on home exercise therapy and supportive care. - gabapentin  - pursue epidural

## 2023-02-20 NOTE — Progress Notes (Signed)
Virtual Visit via Video Note  I connected with Kristen Gibbs on 02/20/23 at 10:50 AM EDT by a video enabled telemedicine application and verified that I am speaking with the correct person using two identifiers.  Location: Patient: home Provider: office   I discussed the limitations of evaluation and management by telemedicine and the availability of in person appointments. The patient expressed understanding and agreed to proceed.  History of Present Illness:  Kristen Gibbs is a 42 year old female that is presenting with acute worsening of her radicular pain in the cervical region as well as lumbar radiculopathy.  She has done well with previous lumbar epidurals.  She has been active in the yard and her symptoms are exacerbated   Observations/Objective:   Assessment and Plan:  Cervical radiculopathy: Acute on chronic in nature.  Previous MRI is demonstrating an area of impingement.   - Counseled on home exercise therapy and supportive care. - gabapentin  - pursue epidural   Lumbar radiculopathy:  Acute on chronic in nature.  Having pain that is similar to her previous type radicular pain -Counseled on home exercise therapy and supportive care. -Could consider epidural  Follow Up Instructions:    I discussed the assessment and treatment plan with the patient. The patient was provided an opportunity to ask questions and all were answered. The patient agreed with the plan and demonstrated an understanding of the instructions.   The patient was advised to call back or seek an in-person evaluation if the symptoms worsen or if the condition fails to improve as anticipated.    Clare Gandy, MD

## 2023-02-27 ENCOUNTER — Other Ambulatory Visit: Payer: Self-pay | Admitting: Family

## 2023-02-27 ENCOUNTER — Other Ambulatory Visit: Payer: Self-pay | Admitting: Medical

## 2023-03-02 ENCOUNTER — Encounter: Payer: Self-pay | Admitting: *Deleted

## 2023-03-05 ENCOUNTER — Ambulatory Visit
Admission: RE | Admit: 2023-03-05 | Discharge: 2023-03-05 | Disposition: A | Discharge status: Home / Self Care | Payer: 59 | Source: Ambulatory Visit | Attending: Family Medicine | Admitting: Family Medicine

## 2023-03-05 DIAGNOSIS — M50121 Cervical disc disorder at C4-C5 level with radiculopathy: Secondary | ICD-10-CM | POA: Diagnosis not present

## 2023-03-05 DIAGNOSIS — M5412 Radiculopathy, cervical region: Secondary | ICD-10-CM

## 2023-03-05 DIAGNOSIS — M50122 Cervical disc disorder at C5-C6 level with radiculopathy: Secondary | ICD-10-CM | POA: Diagnosis not present

## 2023-03-05 MED ORDER — IOPAMIDOL (ISOVUE-M 300) INJECTION 61%
1.0000 mL | Freq: Once | INTRAMUSCULAR | Status: AC | PRN
  Administered 2023-03-05: 1 mL via EPIDURAL

## 2023-03-05 MED ORDER — TRIAMCINOLONE ACETONIDE 40 MG/ML IJ SUSP (RADIOLOGY)
60.0000 mg | Freq: Once | INTRAMUSCULAR | Status: AC
  Administered 2023-03-05: 60 mg via EPIDURAL

## 2023-03-05 NOTE — Discharge Instructions (Signed)

## 2023-03-10 ENCOUNTER — Encounter: Payer: Self-pay | Admitting: Family Medicine

## 2023-03-24 ENCOUNTER — Other Ambulatory Visit: Payer: Self-pay | Admitting: Family

## 2023-03-31 ENCOUNTER — Encounter: Payer: Self-pay | Admitting: Family

## 2023-04-01 MED ORDER — CITALOPRAM HYDROBROMIDE 10 MG PO TABS: 10.0000 mg | ORAL_TABLET | Freq: Every day | ORAL | 1 refills | Status: DC

## 2023-04-22 ENCOUNTER — Telehealth (INDEPENDENT_AMBULATORY_CARE_PROVIDER_SITE_OTHER): Payer: 59 | Admitting: Family

## 2023-04-22 VITALS — Wt 198.0 lb

## 2023-04-22 DIAGNOSIS — R32 Unspecified urinary incontinence: Secondary | ICD-10-CM | POA: Diagnosis not present

## 2023-04-22 DIAGNOSIS — F418 Other specified anxiety disorders: Secondary | ICD-10-CM

## 2023-04-22 DIAGNOSIS — R232 Flushing: Secondary | ICD-10-CM

## 2023-04-22 DIAGNOSIS — R635 Abnormal weight gain: Secondary | ICD-10-CM

## 2023-04-22 DIAGNOSIS — E669 Obesity, unspecified: Secondary | ICD-10-CM

## 2023-04-22 NOTE — Assessment & Plan Note (Signed)
Persistent despite previous medication (recalled). Reports constant urge to urinate and incomplete bladder emptying. -Refer to urogynecology for further evaluation and management.

## 2023-04-22 NOTE — Assessment & Plan Note (Signed)
Rapid increase in weight, followed by recent loss after discontinuing Metformin. Patient reports healthy diet and exercise. -Check thyroid function at next lab visit. -Reinitiate referral to [facility].

## 2023-04-22 NOTE — Assessment & Plan Note (Signed)
Persistent despite Gabapentin and citalopram use.  -Continue Gabapentin and citalopram for hot flashes. -would defer HRT to her GYN.

## 2023-04-22 NOTE — Patient Instructions (Addendum)
  During our appointment, we discussed your ongoing issues with depression, anxiety, overactive bladder, weight gain, and hot flashes. You reported feeling a bit better since we decreased your Citalopram dosage, but you continue to struggle with overactive bladder and incontinence. You've also experienced significant weight gain, which you believe is due to Metformin. You've been managing your hot flashes with citalopram and Gabapentin.  YOUR PLAN:  -DEPRESSION/ANXIETY: Your depression and anxiety have improved since we decreased your Citalopram dosage. We will continue with this regimen, which also includes Wellbutrin.  -URINARY INCONTINENCE: Despite previous medication, your overactive bladder and incontinence persist. I will refer you to a urogynecologist for further evaluation and treatment.  -WEIGHT GAIN: You've experienced a rapid increase in weight, followed by a recent loss after discontinuing Metformin. We will check your thyroid function at your next lab visit and I will refer you to the [facility].  -HOT FLASHES: Your hot flashes persist despite using Gabapentin. We will continue with this medication as it also helps with your back pain.  INSTRUCTIONS:  Please schedule a physical exam and lab work at your next visit. This will help [place] monitor your overall health and check your thyroid function.

## 2023-04-22 NOTE — Progress Notes (Signed)
MyChart Video Visit    Virtual Visit via Video Note    Patient location: Home. Patient and provider in visit Provider location: Office  I discussed the limitations of evaluation and management by telemedicine and the availability of in person appointments. The patient expressed understanding and agreed to proceed.  Visit Date: 04/22/2023  Today's healthcare provider: Lemont Fillers, NP     Subjective:    Patient ID: Kristen Gibbs, female    DOB: 24-Feb-1981, 42 y.o.   MRN: [id number]  Chief Complaint  Patient presents with   Weight Management Screening    Patient reports weight gain. "Discontinued Metformin"    HPI  History of Present Illness The patient, with a history of depression, anxiety, and overactive bladder, presents for a medication check and to discuss recent weight gain. She reports feeling 'a little bit better' since the dose of Citalopram was decreased from 20mg  to 10mg , as the higher dose was 'slowing me down like way too much.' She continues to take Wellbutrin.  The patient continues to struggle with overactive bladder and incontinence, describing a constant urge to urinate and a sensation of incomplete bladder emptying. She has to 'meditate to empty my bladder' and wear liners due to frequent leakage. She stopped taking Myrbetriq for this issue due to a recall and did not notice any improvement while on it.  The patient reports a significant weight gain, from a baseline of 180-185lbs to 208lbs, which she attributes to Metformin. Since discontinuing Metformin, she has lost 10lbs. She reports a healthy diet, low in carbs and sweets, and regular exercise. She has also started taking cinnamon capsules as a natural alternative to Metformin.  The patient also mentions ongoing hot flashes, which she manages with citalopram and Gabapentin which was originally prescribed for back pain.  Past Medical History:  Diagnosis Date   Cervical radiculopathy  03/31/2022   Lumbar radiculopathy 02/24/2022    Past Surgical History:  Procedure Laterality Date   LEEP      Family History  Problem Relation Age of Onset   Hypertension Mother    Hashimoto's thyroiditis Mother    Hypertension Father    Diabetes Maternal Grandmother    Hashimoto's thyroiditis Maternal Aunt    Diabetes Maternal Aunt     Social History   Socioeconomic History   Marital status: Single    Spouse name: Not on file   Number of children: Not on file   Years of education: Not on file   Highest education level: Not on file  Occupational History   Not on file  Tobacco Use   Smoking status: Former    Packs/day: 0.50    Years: 10.00    Additional pack years: 0.00    Total pack years: 5.00    Types: Cigarettes    Start date: 08/20/2004    Quit date: 08/22/2014    Years since quitting: 8.6   Smokeless tobacco: Never  Vaping Use   Vaping Use: Never used  Substance and Sexual Activity   Alcohol use: Not Currently    Alcohol/week: 1.0 standard drink of alcohol    Types: 1 Cans of beer per week    Comment: soc   Drug use: Never   Sexual activity: Yes    Partners: Male    Birth control/protection: None  Other Topics Concern   Not on file  Social History Narrative   Right handed    Was workng at [employer], quit due to back pain  Stays active with yard work   Single   1 son born 2004   Completed some college    Social Determinants of Corporate investment banker Strain: Not on file  Food Insecurity: Not on file  Transportation Needs: Not on file  Physical Activity: Not on file  Stress: Not on file  Social Connections: Not on file  Intimate Partner Violence: Not on file    Outpatient Medications Prior to Visit  Medication Sig Dispense Refill   buPROPion (WELLBUTRIN) 75 MG tablet TAKE 1 TABLET BY MOUTH TWICE A DAY 180 tablet 1   cetirizine (ZYRTEC) 10 MG tablet TAKE 1 TABLET BY MOUTH EVERY DAY 90 tablet 0   citalopram (CELEXA) 10 MG tablet Take 1 tablet  (10 mg total) by mouth daily. 30 tablet 1   fluticasone (FLONASE) 50 MCG/ACT nasal spray Place 2 sprays into both nostrils daily. 16 g 0   gabapentin (NEURONTIN) 300 MG capsule Take 1 capsule (300 mg total) by mouth 3 (three) times daily. 90 capsule 1   lidocaine (LIDODERM) 5 % Place 1 patch onto the skin every 12 (twelve) hours. Remove & Discard patch within 12 hours or as directed by MD 30 patch 2   benzonatate (TESSALON) 100 MG capsule Take 1 capsule (100 mg total) by mouth 3 (three) times daily as needed. 30 capsule 0   metFORMIN (GLUCOPHAGE) 500 MG tablet TAKE 1 TABLET BY MOUTH TWICE A DAY WITH FOOD (Patient not taking: Reported on 04/22/2023) 60 tablet 5   No facility-administered medications prior to visit.    No Known Allergies  ROS See HPI    Objective:    Physical Exam  Wt 198 lb (89.8 kg)   BMI 31.96 kg/m  Wt Readings from Last 3 Encounters:  04/22/23 198 lb (89.8 kg)  02/20/23 180 lb (81.6 kg)  10/14/22 180 lb (81.6 kg)   Gen: Awake, alert, no acute distress Resp: Breathing is even and non-labored Psych: calm/pleasant demeanor Neuro: Alert and Oriented x 3, + facial symmetry, speech is clear.      Assessment & Plan:   Problem List Items Addressed This Visit       Unprioritized   Urinary incontinence - Primary    Persistent despite previous medication (recalled). Reports constant urge to urinate and incomplete bladder emptying. -Refer to urogynecology for further evaluation and management.      Relevant Orders   Ambulatory referral to Urogynecology   Obesity (BMI 30.0-34.9)     Rapid increase in weight, followed by recent loss after discontinuing Metformin. Patient reports healthy diet and exercise. -Check thyroid function at next lab visit. -Reinitiate [facility] to [facility].       Hot flashes     Persistent despite Gabapentin and citalopram use.  -Continue Gabapentin and citalopram for hot flashes. -would defer HRT to her  GYN.      Depression with anxiety     Improved since decreasing Citalopram to 10mg  daily. Continues on Wellbutrin. -Maintain current regimen of Citalopram 10mg  daily and Wellbutrin.       Other Visit Diagnoses     Weight gain           I have discontinued Kristen Gibbs "Kristen  Gibbs"'s benzonatate and metFORMIN. I am also having her maintain her cetirizine, lidocaine, buPROPion, fluticasone, gabapentin, and citalopram.  No orders of the defined types were placed in this encounter.   I discussed the assessment and treatment plan with the patient. The patient was  provided an opportunity to ask questions and all were answered. The patient agreed with the plan and demonstrated an understanding of the instructions.   The patient was advised to call back or seek an in-person evaluation if the symptoms worsen or if the condition fails to improve as anticipated.   Kristen Fillers, NP [facility] [phone] (phone) [phone] (fax)  [facility]

## 2023-04-22 NOTE — Assessment & Plan Note (Signed)
Improved since decreasing Citalopram to 10mg  daily. Continues on Wellbutrin. -Maintain current regimen of Citalopram 10mg  daily and Wellbutrin.

## 2023-04-25 ENCOUNTER — Other Ambulatory Visit: Payer: Self-pay | Admitting: Family

## 2023-05-06 ENCOUNTER — Ambulatory Visit: Payer: 59 | Admitting: Obstetrics and Gynecology

## 2023-05-06 DIAGNOSIS — R350 Frequency of micturition: Secondary | ICD-10-CM

## 2023-05-06 NOTE — Progress Notes (Deleted)
[facility] New Patient Evaluation and Consultation  Referring Provider: Sandford Craze, NP PCP: Sandford Craze, NP Date of Service: 05/06/2023  SUBJECTIVE Chief Complaint: No chief complaint on file.  History of Present Illness: Kristen Gibbs is a 42 y.o. White or Caucasian female seen in consultation at the request of Dr. Peggyann Juba for evaluation of Incontinence.    ***Review of records significant for: ***  Urinary Symptoms: Leaks urine with cough/ sneeze, laughing, exercise, going from sitting to standing, with a full bladder, and without sensation Leaks 10-20 time(s) per days.  Pad use: 8 liners/ mini-pads per day.   She is bothered by her UI symptoms.  Day time voids 30.  Nocturia: 1-2 times per night to void. Voiding dysfunction: she does not empty her bladder well.  does not use a catheter to empty bladder.  When urinating, she feels a weak stream, difficulty starting urine stream, dribbling after finishing, and the need to urinate multiple times in a row Drinks: *** per day  UTIs: 1 UTI's in the last year.   Denies history of blood in urine, kidney or bladder stones, pyelonephritis, bladder cancer, and kidney cancer  Pelvic Organ Prolapse Symptoms:                  She Denies a feeling of a bulge the vaginal area.  Bowel Symptom: Bowel movements: *** time(s) per {Time; day/week/month:13537} Stool consistency: soft  Straining: yes.  Splinting: yes.  Incomplete evacuation: yes.  She Admits to accidental bowel leakage / fecal incontinence  Occurs: 1-2 time(s) per week  Consistency with leakage: soft  Bowel regimen: diet, fiber, and antidiarrheal Last colonoscopy: Date 2018, Results diagnosed with IBS  Sexual Function Sexually active: no.  Sexual orientation: Straight Pain with sex: No  Pelvic Pain Admits to pelvic pain Location: Left ovary Pain occurs: During ovulation and menstruation  Prior pain treatment: None Improved by:  Nothing Worsened by: Tampons, lifting and straining, walking   Past Medical History:  Past Medical History:  Diagnosis Date   Cervical radiculopathy 03/31/2022   Lumbar radiculopathy 02/24/2022     Past Surgical History:   Past Surgical History:  Procedure Laterality Date   LEEP       Past OB/GYN History: G2 P2 Vaginal deliveries: ***,  Forceps/ Vacuum deliveries: ***, Cesarean section: *** Menopausal: LMP 05/02/23 Contraception: none. Last pap smear was 2023.  Any history of abnormal pap smears: yes.   Medications: She has a current medication list which includes the following prescription(s): bupropion, cetirizine, citalopram, fluticasone, gabapentin, and lidocaine.   Allergies: Patient has No Known Allergies.   Social History:  Social History   Tobacco Use   Smoking status: Former    Packs/day: 0.50    Years: 10.00    Additional pack years: 0.00    Total pack years: 5.00    Types: Cigarettes    Start date: 08/20/2004    Quit date: 08/22/2014    Years since quitting: 8.7   Smokeless tobacco: Never  Vaping Use   Vaping Use: Never used  Substance Use Topics   Alcohol use: Not Currently    Alcohol/week: 1.0 standard drink of alcohol    Types: 1 Cans of beer per week    Comment: soc   Drug use: Never    Relationship status: long-term partner She lives alone.   She is not employed. Regular exercise: Yes: Yoga Stretching  History of abuse: {Yes/No:304960894}  Family History:   Family History  Problem Relation Age of  Onset   Hypertension Mother    Hashimoto's thyroiditis Mother    Hypertension Father    Diabetes Maternal Grandmother    Hashimoto's thyroiditis Maternal Aunt    Diabetes Maternal Aunt      Review of Systems: Review of Systems  Constitutional:  Positive for malaise/fatigue. Negative for fever and weight loss.       +Weight Gain  Respiratory:  Negative for cough, shortness of breath and wheezing.   Cardiovascular:  Negative for chest  pain, palpitations and leg swelling.  Gastrointestinal:  Positive for abdominal pain. Negative for blood in stool.  Genitourinary:  Negative for dysuria and urgency.       +Abnormal periods  Neurological:  Positive for headaches. Negative for dizziness and weakness.  Endo/Heme/Allergies:  Does not bruise/bleed easily.       +Hot flashes  Psychiatric/Behavioral:  Negative for depression and suicidal ideas. The patient is nervous/anxious.      OBJECTIVE Physical Exam: There were no vitals filed for this visit.  Physical Exam   GU / Detailed Urogynecologic Evaluation:  Pelvic Exam: Normal external female genitalia; Bartholin's and Skene's glands normal in appearance; urethral meatus normal in appearance, no urethral masses or discharge.   CST: {gen negative/positive:315881}  Reflexes: bulbocavernosis {DESC; PRESENT/NOT PRESENT:21021351}, anocutaneous {DESC; PRESENT/NOT PRESENT:21021351} ***bilaterally.  Speculum exam reveals normal vaginal mucosa {With/Without:20273} atrophy. Cervix {exam; gyn cervix:30847}. Uterus {exam; pelvic uterus:30849}. Adnexa {exam; adnexa:12223}.    s/p hysterectomy: Speculum exam reveals normal vaginal mucosa {With/Without:20273}  atrophy and normal vaginal cuff.  Adnexa {exam; adnexa:12223}.    With apex supported, anterior compartment defect was {reduced:24765}  Pelvic floor strength {Roman # I-V:19040}/V, puborectalis {Roman # I-V:19040}/V external anal sphincter {Roman # I-V:19040}/V  Pelvic floor musculature: Right levator {Tender/Non-tender:20250}, Right obturator {Tender/Non-tender:20250}, Left levator {Tender/Non-tender:20250}, Left obturator {Tender/Non-tender:20250}  POP-Q:   POP-Q                                               Aa                                               Ba                                                 C                                                Gh                                               Pb                                                tvl  Ap                                               Bp                                                 D      Rectal Exam:  Normal sphincter tone, {rectocele:24766} distal rectocele, enterocoele {DESC; PRESENT/NOT PRESENT:21021351}, no rectal masses, {sign of:24767} dyssynergia when asking the patient to bear down.  Post-Void Residual (PVR) by Bladder Scan: In order to evaluate bladder emptying, we discussed obtaining a postvoid residual and she agreed to this procedure.  Procedure: The ultrasound unit was placed on the patient's abdomen in the suprapubic region after the patient had voided. A PVR of *** ml was obtained by bladder scan.  Laboratory Results: @ENCLABS @   ***I visualized the urine specimen, noting the specimen to be {urine color:24768}  ASSESSMENT AND PLAN Ms. Aspinall is a 42 y.o. with:  1. Urinary frequency       Selmer Dominion, NP   Medical Decision Making:  - Reviewed/ ordered a clinical laboratory test - Reviewed/ ordered a radiologic study - Reviewed/ ordered medicine test - Decision to obtain old records - Discussion of management of or test interpretation with an external physician / other healthcare professional  - Assessment requiring independent historian - Review and summation of prior records - Independent review of image, tracing or specimen

## 2023-05-15 ENCOUNTER — Ambulatory Visit
Admission: RE | Admit: 2023-05-15 | Discharge: 2023-05-15 | Disposition: A | Discharge status: Home / Self Care | Payer: 59 | Source: Ambulatory Visit | Attending: Nurse Practitioner | Admitting: Nurse Practitioner

## 2023-05-15 ENCOUNTER — Telehealth: Payer: 59 | Admitting: Family Medicine

## 2023-05-15 ENCOUNTER — Ambulatory Visit (INDEPENDENT_AMBULATORY_CARE_PROVIDER_SITE_OTHER): Payer: 59

## 2023-05-15 VITALS — BP 119/73 | HR 67 | Temp 98.4°F | Resp 18

## 2023-05-15 DIAGNOSIS — R0789 Other chest pain: Secondary | ICD-10-CM | POA: Diagnosis not present

## 2023-05-15 DIAGNOSIS — M549 Dorsalgia, unspecified: Secondary | ICD-10-CM

## 2023-05-15 DIAGNOSIS — R079 Chest pain, unspecified: Secondary | ICD-10-CM | POA: Diagnosis not present

## 2023-05-15 MED ORDER — NAPROXEN 375 MG PO TABS
375.0000 mg | ORAL_TABLET | Freq: Two times a day (BID) | ORAL | 0 refills | Status: AC
Start: 2023-05-16 — End: 2023-05-23

## 2023-05-15 MED ORDER — CYCLOBENZAPRINE HCL 10 MG PO TABS
10.0000 mg | ORAL_TABLET | Freq: Every evening | ORAL | 0 refills | Status: AC | PRN
Start: 2023-05-15 — End: ?

## 2023-05-15 MED ORDER — KETOROLAC TROMETHAMINE 30 MG/ML IJ SOLN
30.0000 mg | Freq: Once | INTRAMUSCULAR | Status: AC
  Administered 2023-05-15: 30 mg via INTRAMUSCULAR

## 2023-05-15 NOTE — Patient Instructions (Signed)
  Kristen Gibbs, thank you for joining [name], NP for today's virtual visit.  While this provider is not your primary care provider (PCP), if your PCP is located in our provider database this encounter information will be shared with them immediately following your visit.   A [name] MyChart account gives you access to today's visit and all your visits, tests, and labs performed at [facility] " click here if you don't have a [name] MyChart account or go to [email or web address]  Consent: (Patient) Kristen Gibbs provided verbal consent for this virtual visit at the beginning of the encounter.  Current Medications:  Current Outpatient Medications:    buPROPion (WELLBUTRIN) 75 MG tablet, TAKE 1 TABLET BY MOUTH TWICE A DAY, Disp: 180 tablet, Rfl: 1   cetirizine (ZYRTEC) 10 MG tablet, TAKE 1 TABLET BY MOUTH EVERY DAY, Disp: 90 tablet, Rfl: 0   citalopram (CELEXA) 10 MG tablet, TAKE 1 TABLET BY MOUTH EVERY DAY, Disp: 90 tablet, Rfl: 1   fluticasone (FLONASE) 50 MCG/ACT nasal spray, Place 2 sprays into both nostrils daily., Disp: 16 g, Rfl: 0   gabapentin (NEURONTIN) 300 MG capsule, Take 1 capsule (300 mg total) by mouth 3 (three) times daily., Disp: 90 capsule, Rfl: 1   lidocaine (LIDODERM) 5 %, Place 1 patch onto the skin every 12 (twelve) hours. Remove & Discard patch within 12 hours or as directed by MD, Disp: 30 patch, Rfl: 2   Medications ordered in this encounter:  No orders of the defined types were placed in this encounter.    *If you need refills on other medications prior to your next appointment, please contact your pharmacy*  Follow-Up: Call back or seek an in-person evaluation if the symptoms worsen or if the condition fails to improve as anticipated.  [name] [phone]  Other Instructions   If you have been instructed to have an in-person evaluation today at a local Urgent Care facility, please use the link below. It  will take you to a list of all of our available [facility], including address, phone number and hours of operation. Please do not delay care.  [name]  If you or a family member do not have a primary care provider, use the link below to schedule a visit and establish care. When you choose a [facility] primary care physician or advanced practice provider, you gain a long-term partner in health. Find a Primary Care Provider  Learn more about [facility] in-office and virtual care options:  - Get Care Now

## 2023-05-15 NOTE — ED Triage Notes (Signed)
Pt states helping someone move yesterday and lifting heavy things. States having rt chest/shoulder/back pain with SOB when bending. C/o pain on breathing. Taking tylenol, aleve, and icing with little relief.

## 2023-05-15 NOTE — Discharge Instructions (Addendum)
You were given a Toradol injection in clinic today. Do not take any over the counter NSAID's such as Advil, ibuprofen, Aleve, or naproxen for 24 hours.  You may take tylenol if needed You may start Flexeril tonight.  Please of this medication make you drowsy.  Do not drink alcohol or drive on this medication. Start naproxen twice daily for 7 days tomorrow, 6/29.  He may continue cool compresses and or alternate warm and cool compresses to the chest and back as needed.  Please follow-up with your PCP at your scheduled appointment on July 2.  Please go to the emergency room if you develop any worsening symptoms.  I hope you feel better soon!

## 2023-05-15 NOTE — Progress Notes (Signed)
Virtual Visit Consent   Kristen Gibbs, you are scheduled for a virtual visit with a [facility] provider today. Just as with appointments in the office, your consent must be obtained to participate. Your consent will be active for this visit and any virtual visit you may have with one of our providers in the next 365 days. If you have a MyChart account, a copy of this consent can be sent to you electronically.  As this is a virtual visit, video technology does not allow for your provider to perform a traditional examination. This may limit your provider's ability to fully assess your condition. If your provider identifies any concerns that need to be evaluated in person or the need to arrange testing (such as labs, EKG, etc.), we will make arrangements to do so. Although advances in technology are sophisticated, we cannot ensure that it will always work on either your end or our end. If the connection with a video visit is poor, the visit may have to be switched to a telephone visit. With either a video or telephone visit, we are not always able to ensure that we have a secure connection.  By engaging in this virtual visit, you consent to the provision of healthcare and authorize for your insurance to be billed (if applicable) for the services provided during this visit. Depending on your insurance coverage, you may receive a charge related to this service.  I need to obtain your verbal consent now. Are you willing to proceed with your visit today? [name] has provided verbal consent on 05/15/2023 for a virtual visit (video or telephone). [name], NP  Date: 05/15/2023 2:16 PM  Virtual Visit via Video Note   I, [name], connected with  Kristen Gibbs  ([id number], Jul 30, 1981) on 05/15/23 at  2:30 PM EDT by a video-enabled telemedicine application and verified that I am speaking with the correct person using two identifiers.  Location: Patient: Virtual Visit Location Patient:  Home Provider: Virtual Visit Location Provider: Home Office   I discussed the limitations of evaluation and management by telemedicine and the availability of in person appointments. The patient expressed understanding and agreed to proceed.    History of Present Illness: Kristen Gibbs is a 42 y.o. who identifies as a female who was assigned female at birth, and is being seen today for chest and back pain right sided.  Helping a friend move yesterday 05/14/2023 Reports chest and back on the right side is hurting. Gets injection in back for radiculopathy Reports unsure if just spasms or if she pulled something. Pain level 10/10 and getting worse. Reports chest tightness and trouble with taking deep breaths as well Ibuprofen and tylenol and ice along with gabapentin.   Problems:  Patient Active Problem List   Diagnosis Date Noted   Obesity (BMI 30.0-34.9) 04/22/2023   Depression with anxiety 08/20/2022   Hot flashes 05/28/2022   Urinary incontinence 05/28/2022   Irritable bowel syndrome with diarrhea 05/28/2022   Cervical radiculopathy 03/31/2022   Lumbar radiculopathy 02/24/2022    Allergies: No Known Allergies Medications:  Current Outpatient Medications:    buPROPion (WELLBUTRIN) 75 MG tablet, TAKE 1 TABLET BY MOUTH TWICE A DAY, Disp: 180 tablet, Rfl: 1   cetirizine (ZYRTEC) 10 MG tablet, TAKE 1 TABLET BY MOUTH EVERY DAY, Disp: 90 tablet, Rfl: 0   citalopram (CELEXA) 10 MG tablet, TAKE 1 TABLET BY MOUTH EVERY DAY, Disp: 90 tablet, Rfl: 1   fluticasone (FLONASE) 50 MCG/ACT nasal spray,  Place 2 sprays into both nostrils daily., Disp: 16 g, Rfl: 0   gabapentin (NEURONTIN) 300 MG capsule, Take 1 capsule (300 mg total) by mouth 3 (three) times daily., Disp: 90 capsule, Rfl: 1   lidocaine (LIDODERM) 5 %, Place 1 patch onto the skin every 12 (twelve) hours. Remove & Discard patch within 12 hours or as directed by MD, Disp: 30 patch, Rfl: 2  Observations/Objective: Patient is  well-developed, well-nourished in no acute distress.  Resting comfortably  at home.  Head is normocephalic, atraumatic.  No labored breathing.  Speech is clear and coherent with logical content.  Patient is alert and oriented at baseline.    Assessment and Plan:  1. Chest pain, unspecified type   2. Acute right-sided back pain, unspecified back location   -due to the nature of pain and cause we will send her to UC to be assessed in person for xrays and lung assessment -appears to be intercoastal/ spasm related, but rule out with chest pain needed   Patient acknowledged agreement and understanding of the plan.    Follow Up Instructions: I discussed the assessment and treatment plan with the patient. The patient was provided an opportunity to ask questions and all were answered. The patient agreed with the plan and demonstrated an understanding of the instructions.  A copy of instructions were sent to the patient via MyChart unless otherwise noted below.    The patient was advised to call back or seek an in-person evaluation if the symptoms worsen or if the condition fails to improve as anticipated.  Time:  I spent 10 minutes with the patient via telehealth technology discussing the above problems/concerns.    [name], NP

## 2023-05-15 NOTE — ED Provider Notes (Addendum)
[facility]    CSN: [id number] Arrival date & time: 05/15/23  1609      History   Chief Complaint Chief Complaint  Patient presents with   Chest Injury    Back and Chest on Right side pain related to movingHaving some trouble with deep breathing. - Entered by patient   Back Pain    HPI Kristen Gibbs is a 42 y.o. female presents for evaluation of chest pain.  Patient reports she helped some removed yesterday that involved a lot of lifting/carrying heavy items.  She states she had a little bit of right-sided chest and back discomfort that worsened today.  Reports it is a constant pain that is worse with deep breathing or movement.  Feels short of breath because she cannot take a deep breath.  Does states she feels like the pain radiates through towards her back.  No hemoptysis, cough or congestion.  She does report history of cervical radiculopathy but states these are different type symptoms.  She did take Aleve, Tylenol and has been using ice which only gives temporary improvement.  No unilateral leg swelling.  She is not on oral birth control.  She did do a telehealth visit and was advised to be seen in person given her symptoms.  No other concerns at this time.   Back Pain Associated symptoms: chest pain     Past Medical History:  Diagnosis Date   Cervical radiculopathy 03/31/2022   Lumbar radiculopathy 02/24/2022    Patient Active Problem List   Diagnosis Date Noted   Obesity (BMI 30.0-34.9) 04/22/2023   Depression with anxiety 08/20/2022   Hot flashes 05/28/2022   Urinary incontinence 05/28/2022   Irritable bowel syndrome with diarrhea 05/28/2022   Cervical radiculopathy 03/31/2022   Lumbar radiculopathy 02/24/2022    Past Surgical History:  Procedure Laterality Date   LEEP      OB History     Gravida  2   Para  1   Term  1   Preterm      AB  1   Living  1      SAB  1   IAB      Ectopic      Multiple      Live Births  1             Home Medications    Prior to Admission medications   Medication Sig Start Date End Date Taking? Authorizing Provider  buPROPion (WELLBUTRIN) 75 MG tablet TAKE 1 TABLET BY MOUTH TWICE A DAY 12/21/22   Sandford Craze, NP  cetirizine (ZYRTEC) 10 MG tablet TAKE 1 TABLET BY MOUTH EVERY DAY 10/02/22   Sandford Craze, NP  citalopram (CELEXA) 10 MG tablet TAKE 1 TABLET BY MOUTH EVERY DAY 04/26/23   Sandford Craze, NP  cyclobenzaprine (FLEXERIL) 10 MG tablet Take 1 tablet (10 mg total) by mouth at bedtime as needed for muscle spasms. 05/15/23  Yes Radford Pax, NP  fluticasone (FLONASE) 50 MCG/ACT nasal spray Place 2 sprays into both nostrils daily. 01/06/23   [name], NP  gabapentin (NEURONTIN) 300 MG capsule Take 1 capsule (300 mg total) by mouth 3 (three) times daily. 02/20/23   Myra Rude, MD  lidocaine (LIDODERM) 5 % Place 1 patch onto the skin every 12 (twelve) hours. Remove & Discard patch within 12 hours or as directed by MD 10/14/22   Myra Rude, MD  naproxen (NAPROSYN) 375 MG tablet Take 1 tablet (375 mg  total) by mouth 2 (two) times daily for 7 days. 05/16/23 05/23/23 Yes Radford Pax, NP    Family History Family History  Problem Relation Age of Onset   Hypertension Mother    Hashimoto's thyroiditis Mother    Hypertension Father    Diabetes Maternal Grandmother    Hashimoto's thyroiditis Maternal Aunt    Diabetes Maternal Aunt     Social History Social History   Tobacco Use   Smoking status: Former    Packs/day: 0.50    Years: 10.00    Additional pack years: 0.00    Total pack years: 5.00    Types: Cigarettes    Start date: 08/20/2004    Quit date: 08/22/2014    Years since quitting: 8.7   Smokeless tobacco: Never  Vaping Use   Vaping Use: Never used  Substance Use Topics   Alcohol use: Not Currently    Alcohol/week: 1.0 standard drink of alcohol    Types: 1 Cans of beer per week    Comment: soc   Drug use: Never     Allergies    Patient has no known allergies.   Review of Systems Review of Systems  Cardiovascular:  Positive for chest pain.  Musculoskeletal:  Positive for back pain.     Physical Exam Triage Vital Signs ED Triage Vitals  Enc Vitals Group     BP 05/15/23 1617 119/73     Pulse Rate 05/15/23 1617 67     Resp 05/15/23 1617 18     Temp 05/15/23 1617 98.4 F (36.9 C)     Temp Source 05/15/23 1617 Oral     SpO2 05/15/23 1617 96 %     Weight --      Height --      Head Circumference --      Peak Flow --      Pain Score 05/15/23 1619 8     Pain Loc --      Pain Edu? --      Excl. in GC? --    No data found.  Updated Vital Signs BP 119/73 (BP Location: Left Arm)   Pulse 67   Temp 98.4 F (36.9 C) (Oral)   Resp 18   LMP 05/01/2023   SpO2 96%   Visual Acuity Right Eye Distance:   Left Eye Distance:   Bilateral Distance:    Right Eye Near:   Left Eye Near:    Bilateral Near:     Physical Exam Vitals and nursing note reviewed.  Constitutional:      General: She is not in acute distress.    Appearance: Normal appearance. She is not ill-appearing.  HENT:     Head: Normocephalic and atraumatic.  Eyes:     Pupils: Pupils are equal, round, and reactive to light.  Cardiovascular:     Rate and Rhythm: Normal rate and regular rhythm.  Pulmonary:     Effort: Pulmonary effort is normal. No respiratory distress.     Breath sounds: Normal breath sounds. No wheezing.  Chest:     Chest wall: Tenderness present. No deformity, swelling, crepitus or edema.       Comments: Mildly tender to palpation to the right mid chest. Musculoskeletal:       Arms:     Thoracic back: No swelling, deformity, lacerations, spasms, tenderness or bony tenderness.     Comments: She reports to right scapula/thoracic back as site of pain but there is no pain with palpation.  Strength  is 5 out of 5 bilateral upper extremities.  Skin:    General: Skin is warm and dry.  Neurological:     General: No  focal deficit present.     Mental Status: She is alert and oriented to person, place, and time.  Psychiatric:        Mood and Affect: Mood normal.        Behavior: Behavior normal.    PERC Criteria for low probability for Pulmonary Embolism  Age <50 years  Heart rate <100 beats/minute  Oxyhemoglobin saturation ?95 percent  No hemoptysis  No estrogen use  No prior DVT or PE  No unilateral leg swelling  No surgery/trauma requiring hospitalization within the prior four weeks  Total score: 0    UC Treatments / Results  Labs (all labs ordered are listed, but only abnormal results are displayed) Labs Reviewed - No data to display  Comp Met (CMET) Order: [id number] Status: Final result     Visible to patient: Yes (seen)     Next appt: 05/19/2023 at 02:40 PM in Family Medicine (Lemont Fillers, NP)     Dx: Wellness examination   3 Result Notes     1 Patient Communication     1 Follow-up Encounter    Component Ref Range & Units 1 yr ago  Glucose, Bld 65 - 99 mg/dL 88  Comment: .            Fasting reference interval .  BUN 7 - 25 mg/dL 14  Creat 4.09 - 8.11 mg/dL 9.14  BUN/Creatinine Ratio 6 - 22 (calc) NOT APPLICABLE  Sodium 135 - 146 mmol/L 139  Potassium 3.5 - 5.3 mmol/L 4.2  Chloride 98 - 110 mmol/L 104  CO2 20 - 32 mmol/L 26  Calcium 8.6 - 10.2 mg/dL 78.2  Total Protein 6.1 - 8.1 g/dL 8.0  Albumin 3.6 - 5.1 g/dL 5.0  Globulin 1.9 - 3.7 g/dL (calc) 3.0  AG Ratio 1.0 - 2.5 (calc) 1.7  Total Bilirubin 0.2 - 1.2 mg/dL 0.4  Alkaline phosphatase (APISO) 31 - 125 U/L 54  AST 10 - 30 U/L 25  ALT 6 - 29 U/L 26  Resulting Agency [facility]         Specimen Collected: 12/06/21 15:56 Last Resulted: 12/07/21 01:36             EKG   Radiology DG Chest 2 View  Result Date: 05/15/2023 CLINICAL DATA:  Chest pain EXAM: CHEST - 2 VIEW COMPARISON:  None Available. FINDINGS: The heart size and mediastinal contours  are within normal limits. Both lungs are clear. The visualized skeletal structures are unremarkable. IMPRESSION: No active cardiopulmonary disease. Electronically Signed   By: Jasmine Pang M.D.   On: 05/15/2023 17:12    Procedures ED EKG  Date/Time: 05/15/2023 5:34 PM  Performed by: Radford Pax, NP Authorized by: Radford Pax, NP   ECG interpreted by ED Physician in the absence of a cardiologist: no   Previous ECG:    Previous ECG:  Unavailable Rate:    ECG rate:  59   ECG rate assessment: bradycardic   Rhythm:    Rhythm: sinus bradycardia   Ectopy:    Ectopy: none   ST segments:    ST segments:  Normal T waves:    T waves: normal   Comments:     SB HR 59 with no acute ST-T wave changes  (including critical care time)  Medications Ordered in UC Medications  ketorolac (TORADOL) 30 MG/ML injection 30 mg (30 mg Intramuscular Given 05/15/23 1728)    Initial Impression / Assessment and Plan / UC Course  I have reviewed the triage vital signs and the nursing notes.  Pertinent labs & imaging results that were available during my care of the patient were reviewed by me and considered in my medical decision making (see chart for details).     Reviewed exam and symptoms with patient.  Chest x-ray unremarkable and EKG shows sinus bradycardia with no acute changes.  Score of 0.  Discussed likely musculoskeletal cause of pain.  Patient was given Toradol injection in clinic.  She was monitored for 15 minutes after injection with no reaction noted and tolerated well.  Reports minimal improvement after injection.  Will do trial of Flexeril.  Side effect profile reviewed.  Start naproxen twice daily for 7 days tomorrow.  Continue cool or warm compresses to the area as needed.  Patient has a scheduled appoint with her PCP on July 2 and will follow-up then if symptoms or not improving.  Strict ER precautions were reviewed and patient verbalized understanding. Final Clinical Impressions(s) / UC  Diagnoses   Final diagnoses:  Right-sided chest pain  Right-sided chest wall pain     Discharge Instructions      You were given a Toradol injection in clinic today. Do not take any over the counter NSAID's such as Advil, ibuprofen, Aleve, or naproxen for 24 hours.  You may take tylenol if needed You may start Flexeril tonight.  Please of this medication make you drowsy.  Do not drink alcohol or drive on this medication. Start naproxen twice daily for 7 days tomorrow, 6/29.  He may continue cool compresses and or alternate warm and cool compresses to the chest and back as needed.  Please follow-up with your PCP at your scheduled appointment on July 2.  Please go to the emergency room if you develop any worsening symptoms.  I hope you feel better soon!     ED Prescriptions     Medication Sig Dispense Auth. Provider   cyclobenzaprine (FLEXERIL) 10 MG tablet Take 1 tablet (10 mg total) by mouth at bedtime as needed for muscle spasms. 10 tablet Radford Pax, NP   naproxen (NAPROSYN) 375 MG tablet Take 1 tablet (375 mg total) by mouth 2 (two) times daily for 7 days. 14 tablet Radford Pax, NP      PDMP not reviewed this encounter.   Radford Pax, NP 05/15/23 1749    Radford Pax, NP 05/15/23 1750

## 2023-05-19 ENCOUNTER — Ambulatory Visit: Payer: 59 | Admitting: Family

## 2023-05-23 ENCOUNTER — Telehealth: Payer: 59 | Admitting: Family Medicine

## 2023-05-23 DIAGNOSIS — H5789 Other specified disorders of eye and adnexa: Secondary | ICD-10-CM | POA: Diagnosis not present

## 2023-05-23 MED ORDER — OLOPATADINE HCL 0.1 % OP SOLN
1.0000 [drp] | Freq: Two times a day (BID) | OPHTHALMIC | 0 refills | Status: AC
Start: 2023-05-23 — End: 2023-06-02

## 2023-05-23 NOTE — Progress Notes (Signed)
Virtual Visit Consent   Kristen Gibbs, you are scheduled for a virtual visit with a [facility] provider today. Just as with appointments in the office, your consent must be obtained to participate. Your consent will be active for this visit and any virtual visit you may have with one of our providers in the next 365 days. If you have a MyChart account, a copy of this consent can be sent to you electronically.  As this is a virtual visit, video technology does not allow for your provider to perform a traditional examination. This may limit your provider's ability to fully assess your condition. If your provider identifies any concerns that need to be evaluated in person or the need to arrange testing (such as labs, EKG, etc.), we will make arrangements to do so. Although advances in technology are sophisticated, we cannot ensure that it will always work on either your end or our end. If the connection with a video visit is poor, the visit may have to be switched to a telephone visit. With either a video or telephone visit, we are not always able to ensure that we have a secure connection.  By engaging in this virtual visit, you consent to the provision of healthcare and authorize for your insurance to be billed (if applicable) for the services provided during this visit. Depending on your insurance coverage, you may receive a charge related to this service.  I need to obtain your verbal consent now. Are you willing to proceed with your visit today? Kristen Gibbs has provided verbal consent on 05/23/2023 for a virtual visit (video or telephone). Reed Pandy, [place]  Date: 05/23/2023 12:25 PM  Virtual Visit via Video Note   I, Reed Pandy, connected with  Kristen Gibbs  ([id number], [birth year]) on 05/23/23 at 12:15 PM EDT by a video-enabled telemedicine application and verified that I am speaking with the correct person using two identifiers.  Location: Patient: Virtual Visit Location Patient:  Home Provider: Virtual Visit Location Provider: Home Office   I discussed the limitations of evaluation and management by telemedicine and the availability of in person appointments. The patient expressed understanding and agreed to proceed.    History of Present Illness: Kristen Gibbs is a 42 y.o. who identifies as a female who was assigned female at birth, and is being seen today for  c/o a small cyst on her eye. Pt states this occurred two days ago and recently woke up with a swollen eye.  Pt states the eyelid is red and swollen. Pt denies discharge from the right eye. Pt states using visine allergy drops but has not helped.  Pt states takes zyrtec everyday.   HPI: HPI  Problems:  Patient Active Problem List   Diagnosis Date Noted   Obesity (BMI 30.0-34.9) 04/22/2023   Depression with anxiety 08/20/2022   Hot flashes 05/28/2022   Urinary incontinence 05/28/2022   Irritable bowel syndrome with diarrhea 05/28/2022   Cervical radiculopathy 03/31/2022   Lumbar radiculopathy 02/24/2022    Allergies: No Known Allergies Medications:  Current Outpatient Medications:    olopatadine (PATADAY) 0.1 % ophthalmic solution, Place 1 drop into the right eye 2 (two) times daily for 10 days., Disp: 1 mL, Rfl: 0   buPROPion (WELLBUTRIN) 75 MG tablet, TAKE 1 TABLET BY MOUTH TWICE A DAY, Disp: 180 tablet, Rfl: 1   cetirizine (ZYRTEC) 10 MG tablet, TAKE 1 TABLET BY MOUTH EVERY DAY, Disp: 90 tablet, Rfl: 0   citalopram (CELEXA) 10 MG tablet, TAKE 1  TABLET BY MOUTH EVERY DAY, Disp: 90 tablet, Rfl: 1   cyclobenzaprine (FLEXERIL) 10 MG tablet, Take 1 tablet (10 mg total) by mouth at bedtime as needed for muscle spasms., Disp: 10 tablet, Rfl: 0   fluticasone (FLONASE) 50 MCG/ACT nasal spray, Place 2 sprays into both nostrils daily., Disp: 16 g, Rfl: 0   gabapentin (NEURONTIN) 300 MG capsule, Take 1 capsule (300 mg total) by mouth 3 (three) times daily., Disp: 90 capsule, Rfl: 1   lidocaine (LIDODERM) 5 %,  Place 1 patch onto the skin every 12 (twelve) hours. Remove & Discard patch within 12 hours or as directed by MD, Disp: 30 patch, Rfl: 2   naproxen (NAPROSYN) 375 MG tablet, Take 1 tablet (375 mg total) by mouth 2 (two) times daily for 7 days., Disp: 14 tablet, Rfl: 0  Observations/Objective: Patient is well-developed, well-nourished in no acute distress.  Resting comfortably at home.  Head is normocephalic, atraumatic.  No labored breathing.  Speech is clear and coherent with logical content.  Patient is alert and oriented at baseline.    Assessment and Plan: 1. Eye swelling, right - olopatadine (PATADAY) 0.1 % ophthalmic solution; Place 1 drop into the right eye 2 (two) times daily for 10 days.  Dispense: 1 mL; Refill: 0  -Pt to follow up with PCP if no improvement or worsening symptoms  Follow Up Instructions: I discussed the assessment and treatment plan with the patient. The patient was provided an opportunity to ask questions and all were answered. The patient agreed with the plan and demonstrated an understanding of the instructions.  A copy of instructions were sent to the patient via MyChart unless otherwise noted below.     The patient was advised to call back or seek an in-person evaluation if the symptoms worsen or if the condition fails to improve as anticipated.  Time:  I spent 15 minutes with the patient via telehealth technology discussing the above problems/concerns.    Reed Pandy, PA-C

## 2023-05-23 NOTE — Patient Instructions (Signed)
[name], thank you for joining Reed Pandy, PA-C for today's virtual visit.  While this provider is not your primary care provider (PCP), if your PCP is located in our provider database this encounter information will be shared with them immediately following your visit.   A [facility] MyChart account gives you access to today's visit and all your visits, tests, and labs performed at [facility] " click here if you don't have a [facility] MyChart account or go to [email or web address]  Consent: (Patient) Shilpa Spoonemore provided verbal consent for this virtual visit at the beginning of the encounter.  Current Medications:  Current Outpatient Medications:    olopatadine (PATADAY) 0.1 % ophthalmic solution, Place 1 drop into the right eye 2 (two) times daily for 10 days., Disp: 1 mL, Rfl: 0   buPROPion (WELLBUTRIN) 75 MG tablet, TAKE 1 TABLET BY MOUTH TWICE A DAY, Disp: 180 tablet, Rfl: 1   cetirizine (ZYRTEC) 10 MG tablet, TAKE 1 TABLET BY MOUTH EVERY DAY, Disp: 90 tablet, Rfl: 0   citalopram (CELEXA) 10 MG tablet, TAKE 1 TABLET BY MOUTH EVERY DAY, Disp: 90 tablet, Rfl: 1   cyclobenzaprine (FLEXERIL) 10 MG tablet, Take 1 tablet (10 mg total) by mouth at bedtime as needed for muscle spasms., Disp: 10 tablet, Rfl: 0   fluticasone (FLONASE) 50 MCG/ACT nasal spray, Place 2 sprays into both nostrils daily., Disp: 16 g, Rfl: 0   gabapentin (NEURONTIN) 300 MG capsule, Take 1 capsule (300 mg total) by mouth 3 (three) times daily., Disp: 90 capsule, Rfl: 1   lidocaine (LIDODERM) 5 %, Place 1 patch onto the skin every 12 (twelve) hours. Remove & Discard patch within 12 hours or as directed by MD, Disp: 30 patch, Rfl: 2   naproxen (NAPROSYN) 375 MG tablet, Take 1 tablet (375 mg total) by mouth 2 (two) times daily for 7 days., Disp: 14 tablet, Rfl: 0   Medications ordered in this encounter:  Meds ordered this encounter  Medications   olopatadine (PATADAY) 0.1 % ophthalmic  solution    Sig: Place 1 drop into the right eye 2 (two) times daily for 10 days.    Dispense:  1 mL    Refill:  0     *If you need refills on other medications prior to your next appointment, please contact your pharmacy*  Follow-Up: Call back or seek an in-person evaluation if the symptoms worsen or if the condition fails to improve as anticipated.  [facility] [phone]  Other Instructions Blepharitis Blepharitis is swelling of the eyelids. It can cause the eyes to feel dry or gritty. Other symptoms may include: Reddish, scaly skin around the scalp and eyebrows. Eyelids that itch or burn. Fluid that leaks from the eye at night. This causes the eyelashes to stick together in the morning. Eyelashes that fall out. Redness of the eyes. Eyes that are sensitive to light. Follow these instructions at home: Watch for any changes in how your eyes look or feel. Tell your doctor about any changes. Follow these instructions to help with your condition. Keeping clean Wash your hands often with soap and water for at least 20 seconds. Clean your eyes. Wash the edges of your eyelids using eyelid wipes or a small amount of baby shampoo that has been mixed with warm water (diluted). Do this 2 or more times a day. Wash your face and eyebrows at least once a day. Use a clean towel each time you dry your eyelids. Do  not use the towel to clean or dry other areas of your body. Do not share your towel with anyone. General instructions Avoid wearing makeup until you get better. Do not share makeup with anyone. Avoid rubbing your eyes. Use a warm compress on your eyes for 5-10 minutes at a time. Do this 1 or 2 times a day, or as told by your doctor. You can use: A towel with warm water on it. A heating pad that can be warmed in the microwave. The pad should be very warm but not hot enough to burn the skin. If you were given an antibiotic cream or eye drops, use the medicine as told by  your doctor. Do not stop using the medicine even if you feel better. Keep all follow-up visits. Contact a doctor if: Your eyelids feel hot. You have blisters on your eyelids. You have a rash on your eyelids. The swelling does not go away in 2-4 days. The swelling gets worse. Get help right away if: You have pain that gets worse or spreads to other parts of your face. You have redness that gets worse or spreads to other parts of your face. You have changes in how you see (vision). You have pain when you look at lights or things that move. You have a fever. Summary Blepharitis is swelling of the eyelids. Watch for any changes in how your eyes look or feel. Tell your doctor about any changes. Follow home care instructions as told by your doctor. Wash your hands often with soap and water for at least 20 seconds. Avoid wearing makeup. Do not rub your eyes. Use a warm compress, creams, or eye drops as told by your doctor. Let your doctor know if you have changes in how you see, blisters or a rash on your eyelids, or other problems. This information is not intended to replace advice given to you by your health care provider. Make sure you discuss any questions you have with your health care provider. Document Revised: 12/05/2020 Document Reviewed: 12/05/2020 Elsevier Patient Education  2024 [employer]vier [employer].    If you have been instructed to have an in-person evaluation today at a local Urgent Care facility, please use the link below. It will take you to a list of all of our available [facility], including address, phone number and hours of operation. Please do not delay care.  [facility]  If you or a family member do not have a primary care provider, use the link below to schedule a visit and establish care. When you choose a [facility] primary care physician or advanced practice provider, you gain a long-term partner in health. Find a Primary Care Provider  Learn  more about [facility]'s in-office and virtual care options: [facility] - Get Care Now

## 2023-05-27 DIAGNOSIS — F418 Other specified anxiety disorders: Secondary | ICD-10-CM | POA: Diagnosis not present

## 2023-05-27 DIAGNOSIS — M5416 Radiculopathy, lumbar region: Secondary | ICD-10-CM | POA: Diagnosis not present

## 2023-05-27 DIAGNOSIS — Z7689 Persons encountering health services in other specified circumstances: Secondary | ICD-10-CM | POA: Diagnosis not present

## 2023-05-28 ENCOUNTER — Other Ambulatory Visit: Payer: Self-pay | Admitting: Family

## 2023-05-28 DIAGNOSIS — F418 Other specified anxiety disorders: Secondary | ICD-10-CM | POA: Diagnosis not present

## 2023-06-03 DIAGNOSIS — F418 Other specified anxiety disorders: Secondary | ICD-10-CM | POA: Diagnosis not present

## 2023-06-03 DIAGNOSIS — E669 Obesity, unspecified: Secondary | ICD-10-CM | POA: Diagnosis not present

## 2023-06-03 DIAGNOSIS — M5416 Radiculopathy, lumbar region: Secondary | ICD-10-CM | POA: Diagnosis not present

## 2023-06-03 DIAGNOSIS — Z0000 Encounter for general adult medical examination without abnormal findings: Secondary | ICD-10-CM | POA: Diagnosis not present

## 2023-08-20 DIAGNOSIS — Z87891 Personal history of nicotine dependence: Secondary | ICD-10-CM | POA: Diagnosis not present

## 2023-08-20 DIAGNOSIS — Z79899 Other long term (current) drug therapy: Secondary | ICD-10-CM | POA: Diagnosis not present

## 2023-08-20 DIAGNOSIS — R000 Tachycardia, unspecified: Secondary | ICD-10-CM | POA: Diagnosis not present

## 2023-08-20 DIAGNOSIS — R002 Palpitations: Secondary | ICD-10-CM | POA: Diagnosis not present

## 2023-08-20 DIAGNOSIS — R42 Dizziness and giddiness: Secondary | ICD-10-CM | POA: Diagnosis not present

## 2023-09-14 DIAGNOSIS — R42 Dizziness and giddiness: Secondary | ICD-10-CM | POA: Diagnosis not present

## 2023-09-14 DIAGNOSIS — I493 Ventricular premature depolarization: Secondary | ICD-10-CM | POA: Diagnosis not present

## 2023-09-29 ENCOUNTER — Encounter: Payer: Self-pay | Admitting: Sports Medicine

## 2023-09-29 ENCOUNTER — Ambulatory Visit (INDEPENDENT_AMBULATORY_CARE_PROVIDER_SITE_OTHER): Payer: 59 | Admitting: Sports Medicine

## 2023-09-29 VITALS — BP 112/80 | Ht 66.0 in | Wt 210.0 lb

## 2023-09-29 DIAGNOSIS — M5412 Radiculopathy, cervical region: Secondary | ICD-10-CM | POA: Diagnosis not present

## 2023-09-29 DIAGNOSIS — M2550 Pain in unspecified joint: Secondary | ICD-10-CM

## 2023-09-29 MED ORDER — GABAPENTIN 300 MG PO CAPS
300.0000 mg | ORAL_CAPSULE | Freq: Three times a day (TID) | ORAL | 1 refills | Status: DC
Start: 2023-09-29 — End: 2024-04-10

## 2023-09-29 NOTE — Progress Notes (Signed)
   Subjective:    Patient ID: Kristen Gibbs, female    DOB: 1981-03-16, 42 y.o.   MRN: [id number]  HPI chief complaint: Neck and low back pain  Patient is a very pleasant 42 year old female that presents today with chronic ongoing neck and low back pain.  She has been seen here previously.  An MRI of her lumbar spine done in April 2023 showed a subtle right sided foraminal disc protrusion at L5-S1.  An MRI of the cervical spine done in June of that same year showed moderate right foraminal stenosis.  She has undergone epidural steroid injections for both of these problems and they have been temporarily helpful.  Today she complains primarily of tightness and stiffness in her neck and low back that is made worse with activity such as exercise.  She describes a tight feeling in her legs as well.  She feels like her body will swell on occasion.  She does get some radiating pain into the right arm and right leg.  Associated numbness and tingling with these as well.  She is tried gabapentin in the past which was helpful.  She is also tried physical therapy which was not helpful.    Review of Systems As above    Objective:   Physical Exam  Well-developed, well-nourished.  No acute distress  Cervical spine: Some limited cervical range of motion to the right.  Neurological exam: Strength is 5/5 in bilateral upper and lower extremities.  Reflexes are brisk and equal at the biceps, triceps, brachial radialis, Achilles, and patellar reflexes bilaterally.  No atrophy.      Assessment & Plan:   Cervical radiculopathy Diffuse arthralgias  I believe some of the patient's symptoms are explained by her MRI findings but others or not.  MRI of her cervical spine certainly shows foraminal stenosis at C5-C6 and C6-C7.  This is likely contributing to some of her right-sided neck and arm pain.  Therefore, we will order a repeat cervical ESI.  Her other symptoms such as a diffuse tightening and swelling of her  body with exercise are not explained by her MRI.  I recommended that we do a rheumatology screen to rule out obvious systemic arthropathies.  I will order a sed rate, C-reactive protein, ANA, rheumatoid factor, and anti-CCP antibody.  I will follow-up with her via MyChart with those lab results when available.  I will also refill her gabapentin 300 mg nightly.  This note was dictated using Dragon naturally speaking software and may contain errors in syntax, spelling, or content which have not been identified prior to signing this note.

## 2023-09-30 ENCOUNTER — Encounter: Payer: Self-pay | Admitting: Sports Medicine

## 2023-10-02 LAB — SEDIMENTATION RATE: Sed Rate: 15 mm/h (ref 0–32)

## 2023-10-02 LAB — C-REACTIVE PROTEIN: CRP: 2 mg/L (ref 0–10)

## 2023-10-02 LAB — ANA,IFA RA DIAG PNL W/RFLX TIT/PATN
ANA Titer 1: NEGATIVE
Cyclic Citrullin Peptide Ab: 9 U (ref 0–19)
Rheumatoid fact SerPl-aCnc: <10 [IU]/mL (ref <14.0)

## 2023-10-07 ENCOUNTER — Encounter: Payer: Self-pay | Admitting: Sports Medicine

## 2023-10-09 ENCOUNTER — Other Ambulatory Visit: Payer: Self-pay | Admitting: Medical

## 2023-10-23 ENCOUNTER — Encounter: Payer: Self-pay | Admitting: Sports Medicine

## 2023-10-23 ENCOUNTER — Ambulatory Visit (INDEPENDENT_AMBULATORY_CARE_PROVIDER_SITE_OTHER): Payer: 59 | Admitting: Sports Medicine

## 2023-10-23 ENCOUNTER — Other Ambulatory Visit: Payer: Self-pay | Admitting: Sports Medicine

## 2023-10-23 DIAGNOSIS — M2550 Pain in unspecified joint: Secondary | ICD-10-CM | POA: Diagnosis not present

## 2023-10-23 DIAGNOSIS — M5412 Radiculopathy, cervical region: Secondary | ICD-10-CM | POA: Diagnosis not present

## 2023-10-23 DIAGNOSIS — M5416 Radiculopathy, lumbar region: Secondary | ICD-10-CM | POA: Diagnosis not present

## 2023-10-23 MED ORDER — CELECOXIB 200 MG PO CAPS
ORAL_CAPSULE | ORAL | 2 refills | Status: DC
Start: 2023-10-23 — End: 2024-02-08

## 2023-10-23 NOTE — Assessment & Plan Note (Signed)
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 She is also on gabapentin which seems to be helping a bit. On Celexa for anxiety and depression. She is not really doing much physical therapy or taking an NSAID. Main complaint is achiness, stiffness and pain. Occasional right sided C8 distribution radiculitis. I explained anatomy and evolutionary anthropology of cervical and lumbar disc disease, she understands the treatment approach, we also set realistic expectations. I would like her to do some cervical home physical therapy more for strengthening rather than stretching. She will proceed with her cervical epidural and we will start Celebrex. Return to see me in 6 weeks, we will consider switching from Celexa to Cymbalta if insufficient improvement.

## 2023-10-23 NOTE — Progress Notes (Signed)
    Procedures performed today:    None.  Independent interpretation of notes and tests performed by another provider:   None.  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 She is also on gabapentin which seems to be helping a bit. On Celexa for anxiety and depression. She is not really doing much physical therapy or taking an NSAID. Main complaint is achiness, stiffness and pain. Occasional right sided C8 distribution radiculitis. I explained anatomy and evolutionary anthropology of cervical and lumbar disc disease, she understands the treatment approach, we also set realistic expectations. I would like her to do some cervical home physical therapy more for strengthening rather than stretching. She will proceed with her cervical epidural and we will start Celebrex. Return to see me in 6 weeks, we will consider switching from Celexa to Cymbalta if insufficient improvement.  Lumbar radiculopathy Also with chronic axial low back pain, mostly right-sided. Lumbar spine MRI from 2023 does show a very small L5-S1 disc retrusion potentially coming close to the exiting right L5 nerve root. Pain is predominantly at the sacroiliac joint and worse with standing, I suspect more of a SI joint pain generator. She did have a lumbar epidural in the past that did not help. She does continue with gabapentin. As above we discussed the anatomy and treatment approach, she will continue gabapentin, adding home physical therapy, adding Celebrex. At the 6-week follow-up if not better we will try diagnostic and therapeutic right sacroiliac joint injection. We will also consider changing from Celexa to Cymbalta.  Polyarthralgia This pleasant 42 year old female does have widespread aches and pains, stiffness, she had a rheumatoid workup that was negative. I think we  are likely dealing with symptoms from her cervical and lumbar disc disease. She is going to work aggressively on weight loss with her PCP. I would like her to try more of a Mediterranean diet in the meantime, adding Celebrex. In the future we may consider switching from Celexa to Cymbalta and following more of a myofascial/fibromyalgia type pathway if unable to get good control over her widespread aches and pains.    ____________________________________________ Ihor Austin. Benjamin Stain, M.D., ABFM., CAQSM., AME. Primary Care and Sports Medicine [place] [facility]  Adjunct Professor of Family Medicine  [employer] [facility] [employer]  Restaurant manager, fast food

## 2023-10-23 NOTE — Assessment & Plan Note (Signed)
Also with chronic axial low back pain, mostly right-sided. Lumbar spine MRI from 2023 does show a very small L5-S1 disc retrusion potentially coming close to the exiting right L5 nerve root. Pain is predominantly at the sacroiliac joint and worse with standing, I suspect more of a SI joint pain generator. She did have a lumbar epidural in the past that did not help. She does continue with gabapentin. As above we discussed the anatomy and treatment approach, she will continue gabapentin, adding home physical therapy, adding Celebrex. At the 6-week follow-up if not better we will try diagnostic and therapeutic right sacroiliac joint injection. We will also consider changing from Celexa to Cymbalta.

## 2023-10-23 NOTE — Patient Instructions (Signed)

## 2023-10-23 NOTE — Assessment & Plan Note (Addendum)
This pleasant 42 year old female does have widespread aches and pains, stiffness, she had a rheumatoid workup that was negative. I think we are likely dealing with symptoms from her cervical and lumbar disc disease. She is going to work aggressively on weight loss with her PCP. I would like her to try more of a Mediterranean diet in the meantime, adding Celebrex. In the future we may consider switching from Celexa to Cymbalta and following more of a myofascial/fibromyalgia type pathway if unable to get good control over her widespread aches and pains.

## 2023-10-27 NOTE — Discharge Instructions (Signed)

## 2023-10-28 ENCOUNTER — Ambulatory Visit
Admission: RE | Admit: 2023-10-28 | Discharge: 2023-10-28 | Disposition: A | Discharge status: Home / Self Care | Payer: 59 | Source: Ambulatory Visit | Attending: Sports Medicine | Admitting: Sports Medicine

## 2023-10-28 DIAGNOSIS — M5412 Radiculopathy, cervical region: Secondary | ICD-10-CM

## 2023-10-28 MED ORDER — IOPAMIDOL (ISOVUE-M 300) INJECTION 61%
1.0000 mL | Freq: Once | INTRAMUSCULAR | Status: AC | PRN
  Administered 2023-10-28: 1 mL via EPIDURAL

## 2023-10-28 MED ORDER — TRIAMCINOLONE ACETONIDE 40 MG/ML IJ SUSP (RADIOLOGY)
60.0000 mg | Freq: Once | INTRAMUSCULAR | Status: AC
  Administered 2023-10-28: 60 mg via EPIDURAL

## 2023-10-29 DIAGNOSIS — E669 Obesity, unspecified: Secondary | ICD-10-CM | POA: Diagnosis not present

## 2023-11-13 ENCOUNTER — Telehealth: Payer: 59 | Admitting: Family Medicine

## 2023-11-13 DIAGNOSIS — B9689 Other specified bacterial agents as the cause of diseases classified elsewhere: Secondary | ICD-10-CM

## 2023-11-13 DIAGNOSIS — H9209 Otalgia, unspecified ear: Secondary | ICD-10-CM | POA: Diagnosis not present

## 2023-11-13 DIAGNOSIS — J019 Acute sinusitis, unspecified: Secondary | ICD-10-CM | POA: Diagnosis not present

## 2023-11-13 MED ORDER — AMOXICILLIN-POT CLAVULANATE 875-125 MG PO TABS: 1.0000 | ORAL_TABLET | Freq: Two times a day (BID) | ORAL | 0 refills | Status: DC

## 2023-11-13 NOTE — Progress Notes (Signed)
E-Visit for Sinus Problems  We are sorry that you are not feeling well.  Here is how we plan to help!  Based on what you have shared with me it looks like you have sinusitis.  Sinusitis is inflammation and infection in the sinus cavities of the head.  Based on your presentation I believe you most likely have Acute Bacterial Sinusitis.  This is an infection caused by bacteria and is treated with antibiotics. I have prescribed Augmentin 875mg/125mg one tablet twice daily with food, for 7 days. You may use an oral decongestant such as Mucinex D or if you have glaucoma or high blood pressure use plain Mucinex. Saline nasal spray help and can safely be used as often as needed for congestion.  If you develop worsening sinus pain, fever or notice severe headache and vision changes, or if symptoms are not better after completion of antibiotic, please schedule an appointment with a health care provider.    Sinus infections are not as easily transmitted as other respiratory infection, however we still recommend that you avoid close contact with loved ones, especially the very young and elderly.  Remember to wash your hands thoroughly throughout the day as this is the number one way to prevent the spread of infection!  Home Care: Only take medications as instructed by your medical team. Complete the entire course of an antibiotic. Do not take these medications with alcohol. A steam or ultrasonic humidifier can help congestion.  You can place a towel over your head and breathe in the steam from hot water coming from a faucet. Avoid close contacts especially the very young and the elderly. Cover your mouth when you cough or sneeze. Always remember to wash your hands.  Get Help Right Away If: You develop worsening fever or sinus pain. You develop a severe head ache or visual changes. Your symptoms persist after you have completed your treatment plan.  Make sure you Understand these instructions. Will watch  your condition. Will get help right away if you are not doing well or get worse.  Thank you for choosing an e-visit.  Your e-visit answers were reviewed by a board certified advanced clinical practitioner to complete your personal care plan. Depending upon the condition, your plan could have included both over the counter or prescription medications.  Please review your pharmacy choice. Make sure the pharmacy is open so you can pick up prescription now. If there is a problem, you may contact your provider through MyChart messaging and have the prescription routed to another pharmacy.  Your safety is important to us. If you have drug allergies check your prescription carefully.   For the next 24 hours you can use MyChart to ask questions about today's visit, request a non-urgent call back, or ask for a work or school excuse. You will get an email in the next two days asking about your experience. I hope that your e-visit has been valuable and will speed your recovery.    have provided 5 minutes of non face to face time during this encounter for chart review and documentation.   

## 2023-11-13 NOTE — Addendum Note (Signed)
Addended by: Georgana Curio on: 11/13/2023 01:03 PM   Modules accepted: Orders

## 2023-11-13 NOTE — Progress Notes (Signed)

## 2023-12-03 DIAGNOSIS — R232 Flushing: Secondary | ICD-10-CM | POA: Diagnosis not present

## 2023-12-03 DIAGNOSIS — N9089 Other specified noninflammatory disorders of vulva and perineum: Secondary | ICD-10-CM | POA: Diagnosis not present

## 2023-12-03 DIAGNOSIS — R61 Generalized hyperhidrosis: Secondary | ICD-10-CM | POA: Diagnosis not present

## 2023-12-03 DIAGNOSIS — N920 Excessive and frequent menstruation with regular cycle: Secondary | ICD-10-CM | POA: Diagnosis not present

## 2023-12-03 DIAGNOSIS — Z124 Encounter for screening for malignant neoplasm of cervix: Secondary | ICD-10-CM | POA: Diagnosis not present

## 2023-12-03 DIAGNOSIS — R140 Abdominal distension (gaseous): Secondary | ICD-10-CM | POA: Diagnosis not present

## 2023-12-03 DIAGNOSIS — N946 Dysmenorrhea, unspecified: Secondary | ICD-10-CM | POA: Diagnosis not present

## 2023-12-04 ENCOUNTER — Ambulatory Visit: Payer: 59 | Admitting: Sports Medicine

## 2023-12-14 ENCOUNTER — Ambulatory Visit (INDEPENDENT_AMBULATORY_CARE_PROVIDER_SITE_OTHER): Payer: 59 | Admitting: Sports Medicine

## 2023-12-14 DIAGNOSIS — M5412 Radiculopathy, cervical region: Secondary | ICD-10-CM

## 2023-12-14 DIAGNOSIS — M5416 Radiculopathy, lumbar region: Secondary | ICD-10-CM

## 2023-12-14 DIAGNOSIS — M2550 Pain in unspecified joint: Secondary | ICD-10-CM | POA: Diagnosis not present

## 2023-12-14 DIAGNOSIS — N62 Hypertrophy of breast: Secondary | ICD-10-CM | POA: Diagnosis not present

## 2023-12-14 MED ORDER — DULOXETINE HCL 30 MG PO CPEP
30.0000 mg | ORAL_CAPSULE | Freq: Every day | ORAL | 3 refills | Status: DC
Start: 2023-12-14 — End: 2024-01-06

## 2023-12-14 NOTE — Assessment & Plan Note (Signed)
Rheumatoid workup is negative, she will continue the Mediterranean diet, Celebrex, we will switch from Celexa to Cymbalta following more of a myofascial/fibromyalgia type treatment pathway. Return to see me in 6 weeks.

## 2023-12-14 NOTE — Assessment & Plan Note (Signed)
Chronic neck pain, Kristen Gibbs does have relatively large pendulous breasts, they do create shoulder ranging from her bra strap. I wonder if this may be resulting in some of her neck pain. On further questioning she has been thinking about this for some time. We will plug her in with a plastic surgery consultation for discussion of breast reduction surgery and in the hopes of getting this approved via insurance.Marland Kitchen

## 2023-12-14 NOTE — Assessment & Plan Note (Signed)
Chronic axial low back pain, right sided, I initially suspected it was coming more from the SI joint. MRI from 2023 did show a small L5-S1 disc protrusion coming close to the exiting right L5 nerve. She has had a couple of lumbar epidurals in the past that were not helpful, she continues with gabapentin. She is doing her home physical therapy. We agreed to first try switching from Celexa to Cymbalta. If this fails we will try diagnostic and therapeutic right sided SI joint injection.

## 2023-12-14 NOTE — Progress Notes (Signed)
    Procedures performed today:    None.  Independent interpretation of notes and tests performed by another provider:   None.  Brief History, Exam, Impression, and Recommendations:    Lumbar radiculopathy Chronic axial low back pain, right sided, I initially suspected it was coming more from the SI joint. MRI from 2023 did show a small L5-S1 disc protrusion coming close to the exiting right L5 nerve. She has had a couple of lumbar epidurals in the past that were not helpful, she continues with gabapentin. She is doing her home physical therapy. We agreed to first try switching from Celexa to Cymbalta. If this fails we will try diagnostic and therapeutic right sided SI joint injection.  Polyarthralgia Rheumatoid workup is negative, she will continue the Mediterranean diet, Celebrex, we will switch from Celexa to Cymbalta following more of a myofascial/fibromyalgia type treatment pathway. Return to see me in 6 weeks.  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 She continues with gabapentin, Celebrex, we are switching from Celexa to Cymbalta for more neuropathic pain relief. She will continue her home therapy. I have also suggested seeking advice regarding macromastia. We will plug her in with a plastic surgery consultation in the hopes of getting this approved via insurance..  Macromastia Chronic neck pain, Jarelyn does have relatively large pendulous breasts, they do create shoulder ranging from her bra strap. I wonder if this may be resulting in some of her neck pain. On further questioning she has been thinking about this for some time. We will plug her in with a plastic surgery consultation for discussion of breast reduction surgery and in the hopes of getting this approved via insurance..    ____________________________________________ Ihor Austin. Benjamin Stain, M.D., ABFM., CAQSM.,  AME. Primary Care and Sports Medicine [place] [facility]  Adjunct Professor of Family Medicine  [place] [employer] [facility] [employer]  Restaurant manager, fast food

## 2023-12-14 NOTE — Assessment & Plan Note (Signed)
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 She continues with gabapentin, Celebrex, we are switching from Celexa to Cymbalta for more neuropathic pain relief. She will continue her home therapy. I have also suggested seeking advice regarding macromastia. We will plug her in with a plastic surgery consultation in the hopes of getting this approved via insurance.Marland Kitchen

## 2023-12-16 ENCOUNTER — Encounter: Payer: Self-pay | Admitting: Sports Medicine

## 2023-12-17 DIAGNOSIS — R61 Generalized hyperhidrosis: Secondary | ICD-10-CM | POA: Diagnosis not present

## 2023-12-17 DIAGNOSIS — R232 Flushing: Secondary | ICD-10-CM | POA: Diagnosis not present

## 2023-12-17 DIAGNOSIS — N921 Excessive and frequent menstruation with irregular cycle: Secondary | ICD-10-CM | POA: Diagnosis not present

## 2023-12-17 DIAGNOSIS — N946 Dysmenorrhea, unspecified: Secondary | ICD-10-CM | POA: Diagnosis not present

## 2024-01-05 ENCOUNTER — Other Ambulatory Visit: Payer: Self-pay | Admitting: Sports Medicine

## 2024-01-05 DIAGNOSIS — M2550 Pain in unspecified joint: Secondary | ICD-10-CM

## 2024-01-07 ENCOUNTER — Institutional Professional Consult (permissible substitution): Payer: 59 | Admitting: Plastic Surgery

## 2024-01-20 ENCOUNTER — Institutional Professional Consult (permissible substitution): Payer: 59 | Admitting: Plastic Surgery

## 2024-01-25 ENCOUNTER — Ambulatory Visit: Payer: 59 | Admitting: Sports Medicine

## 2024-02-01 ENCOUNTER — Ambulatory Visit (INDEPENDENT_AMBULATORY_CARE_PROVIDER_SITE_OTHER): Admitting: Sports Medicine

## 2024-02-01 DIAGNOSIS — M5412 Radiculopathy, cervical region: Secondary | ICD-10-CM | POA: Diagnosis not present

## 2024-02-01 DIAGNOSIS — F418 Other specified anxiety disorders: Secondary | ICD-10-CM

## 2024-02-01 DIAGNOSIS — N62 Hypertrophy of breast: Secondary | ICD-10-CM | POA: Diagnosis not present

## 2024-02-01 DIAGNOSIS — M5416 Radiculopathy, lumbar region: Secondary | ICD-10-CM

## 2024-02-01 DIAGNOSIS — M2550 Pain in unspecified joint: Secondary | ICD-10-CM

## 2024-02-01 MED ORDER — DULOXETINE HCL 60 MG PO CPEP: 60.0000 mg | ORAL_CAPSULE | Freq: Every day | ORAL | 11 refills | Status: DC

## 2024-02-01 NOTE — Assessment & Plan Note (Signed)
 Chronic right sided axial low back pain, initially suspected to be more SI joint, MRI from 2023 did show a small L5-S1 disc retrusion coming close to the exiting L5 nerve root, she has had epidurals in the past that were only minimally helpful. Continues with gabapentin, added home physical therapy and we switched from Celexa to Cymbalta, she has improved considerably with regards to her back. If she has recurrence of pain we would try a therapeutic right sided SI joint injection.

## 2024-02-01 NOTE — Progress Notes (Signed)
    Procedures performed today:    None.  Independent interpretation of notes and tests performed by another provider:   None.  Brief History, Exam, Impression, and Recommendations:    Lumbar radiculopathy Chronic right sided axial low back pain, initially suspected to be more SI joint, MRI from 2023 did show a small L5-S1 disc retrusion coming close to the exiting L5 nerve root, she has had epidurals in the past that were only minimally helpful. Continues with gabapentin, added home physical therapy and we switched from Celexa to Cymbalta, she has improved considerably with regards to her back. If she has recurrence of pain we would try a therapeutic right sided SI joint injection.  Macromastia She also does have macromastia as well as chronic neck pain, we have done a referral to plastic surgery, she has an appointment coming up.  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 Cervical epidurals were minimally effective. Continues with gabapentin, Celebrex. We did switch to Cymbalta for more neuropathic relief, we will go up on Cymbalta and add like to see her back in 6 weeks.  Depression with anxiety Historically controlled on citalopram, we switched her to Cymbalta to get more neuropathic efficacy. She has improved, we will increase Cymbalta to 60 mg daily with a 6-week follow-up.    ____________________________________________ Ihor Austin. Benjamin Stain, M.D., ABFM., CAQSM., AME. Primary Care and Sports Medicine [place] [facility]  Adjunct Professor of Family Medicine  [place] [employer] [facility] [employer]  Restaurant manager, fast food

## 2024-02-01 NOTE — Assessment & Plan Note (Signed)
 She also does have macromastia as well as chronic neck pain, we have done a referral to plastic surgery, she has an appointment coming up.

## 2024-02-01 NOTE — Assessment & Plan Note (Signed)
 Historically controlled on citalopram, we switched her to Cymbalta to get more neuropathic efficacy. She has improved, we will increase Cymbalta to 60 mg daily with a 6-week follow-up.

## 2024-02-01 NOTE — Assessment & Plan Note (Signed)
 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 Cervical epidurals were minimally effective. Continues with gabapentin, Celebrex. We did switch to Cymbalta for more neuropathic relief, we will go up on Cymbalta and add like to see her back in 6 weeks.

## 2024-02-06 ENCOUNTER — Other Ambulatory Visit: Payer: Self-pay | Admitting: Sports Medicine

## 2024-02-06 DIAGNOSIS — M5416 Radiculopathy, lumbar region: Secondary | ICD-10-CM

## 2024-02-17 ENCOUNTER — Ambulatory Visit (INDEPENDENT_AMBULATORY_CARE_PROVIDER_SITE_OTHER): Admitting: Plastic Surgery

## 2024-02-17 VITALS — BP 135/76 | HR 77 | Ht 66.0 in | Wt 186.2 lb

## 2024-02-17 DIAGNOSIS — M546 Pain in thoracic spine: Secondary | ICD-10-CM | POA: Diagnosis not present

## 2024-02-17 DIAGNOSIS — M542 Cervicalgia: Secondary | ICD-10-CM | POA: Diagnosis not present

## 2024-02-17 DIAGNOSIS — N62 Hypertrophy of breast: Secondary | ICD-10-CM | POA: Diagnosis not present

## 2024-02-17 DIAGNOSIS — M25519 Pain in unspecified shoulder: Secondary | ICD-10-CM

## 2024-02-17 NOTE — Progress Notes (Signed)
 Referring Provider Montez Hageman, DO [address] [place],  [place] [address]   CC:  Chief Complaint  Patient presents with   Advice Only      Kristen Gibbs is an 43 y.o. female.  HPI: Ms. Kristen Gibbs is a 43 year old female who is referred by her primary care/sports medicine physician for evaluation of back pain.  Patient states that she has had upper back and neck pain for quite some time and has been treated with steroid injections for cervical radiculopathy for over a year now.  Is been suggested that a breast reduction may help with her ongoing neck and back pain.  No Known Allergies  Outpatient Encounter Medications as of 02/17/2024  Medication Sig Note   buPROPion (WELLBUTRIN) 75 MG tablet TAKE 1 TABLET BY MOUTH TWICE A DAY    celecoxib (CELEBREX) 200 MG capsule ONE TO 2 TABLETS BY MOUTH DAILY AS NEEDED FOR PAIN. 02/17/2024: As needed.    cetirizine (ZYRTEC) 10 MG tablet TAKE 1 TABLET BY MOUTH EVERY DAY    cyclobenzaprine (FLEXERIL) 10 MG tablet Take 1 tablet (10 mg total) by mouth at bedtime as needed for muscle spasms.    DULoxetine (CYMBALTA) 60 MG capsule Take 1 capsule (60 mg total) by mouth daily.    fluticasone (FLONASE) 50 MCG/ACT nasal spray Place 2 sprays into both nostrils daily.    gabapentin (NEURONTIN) 300 MG capsule Take 1 capsule (300 mg total) by mouth 3 (three) times daily. 02/17/2024: Only takes one tablet once daily.    lidocaine (LIDODERM) 5 % Place 1 patch onto the skin every 12 (twelve) hours. Remove & Discard patch within 12 hours or as directed by MD 02/17/2024: As needed.    amoxicillin-clavulanate (AUGMENTIN) 875-125 MG tablet Take 1 tablet by mouth 2 (two) times daily. (Patient not taking: Reported on 02/17/2024)    No facility-administered encounter medications on file as of 02/17/2024.     Past Medical History:  Diagnosis Date   Cervical radiculopathy 03/31/2022   Lumbar radiculopathy 02/24/2022    Past Surgical History:  Procedure Laterality  Date   LEEP      Family History  Problem Relation Age of Onset   Hypertension Mother    Hashimoto's thyroiditis Mother    Hypertension Father    Diabetes Maternal Grandmother    Hashimoto's thyroiditis Maternal Aunt    Diabetes Maternal Aunt     Social History   Social History Narrative   Right handed    Was workng at [employer], quit due to back pain   Stays active with yard work   Single   1 son born 2004   Completed some college      Review of Systems General: Denies fevers, chills, weight loss CV: Denies chest pain, shortness of breath, palpitations Breast: Large breasts which patient and her primary care physician feel may be contributing to her upper back and neck pain.  Physical Exam    02/17/2024    2:44 PM 10/28/2023    8:33 AM 09/29/2023    1:49 PM  Vitals with BMI  Height 5\' 6"   5\' 6"   Weight 186 lbs 3 oz  210 lbs  BMI 30.07  33.91  Systolic 135 126 478  Diastolic 76 56 80  Pulse 77 62     General:  No acute distress,  Alert and oriented, Non-Toxic, Normal speech and affect Breast: Patient has moderately large breasts with grade 2 ptosis.  There are no dominant masses on  physical exam and the nipples are normal in appearance without evidence of nipple discharge today her sternal notch to nipple distance on the right is 30 cm and 30 cm on the left her nipple to fold distance on the right is 13 cm and 14 cm on the left Mammogram: She has not had a mammogram yet.  We will place an order for 1 today. Assessment/Plan Macromastia patient has moderately large breasts and would likely benefit from a bilateral breast reduction.  I believe that I can remove 600 g per breast.  I discussed breast reduction at length with the patient and her husband.  I showed her the location of the incisions and we discussed the unpredictable nature of scarring and wound healing.  We discussed the risks of bleeding, infection, and seroma formation.  She understands that I will use drains  postoperatively.  We discussed the risk of nipple loss due to nipple ischemia.  We discussed the fact that there may be changes in sensation in the breast skin and in  nipples however this is impossible to predict preoperatively.  Postoperative physical limitations include no heavy lifting greater than 20 pounds, no vigorous activity such as running dancing or jumping, and no submerging the incisions in water for 6 weeks.  She will need to wear a compressive garment for 6 weeks.  She may return to light activity as tolerated and she is encouraged to begin ambulating immediately after surgery to help decrease the risk of DVT. All questions were answered to her satisfaction.  Photographs were obtained today with her consent.  Will submit her for mammogram, physical therapy, and a breast reduction at her request.  Santiago Glad 02/17/2024, 3:26 PM

## 2024-02-24 ENCOUNTER — Encounter (INDEPENDENT_AMBULATORY_CARE_PROVIDER_SITE_OTHER): Payer: Self-pay | Admitting: Sports Medicine

## 2024-02-24 DIAGNOSIS — M5412 Radiculopathy, cervical region: Secondary | ICD-10-CM | POA: Diagnosis not present

## 2024-02-24 MED ORDER — ESCITALOPRAM OXALATE 10 MG PO TABS: 10.0000 mg | ORAL_TABLET | Freq: Every day | ORAL | 3 refills | Status: DC

## 2024-02-24 MED ORDER — DULOXETINE HCL 20 MG PO CPEP: ORAL_CAPSULE | ORAL | 0 refills | Status: DC

## 2024-02-24 NOTE — Telephone Encounter (Signed)

## 2024-03-08 ENCOUNTER — Ambulatory Visit

## 2024-03-14 ENCOUNTER — Ambulatory Visit: Admitting: Sports Medicine

## 2024-03-15 ENCOUNTER — Ambulatory Visit: Attending: Plastic Surgery | Admitting: Physical Therapy

## 2024-03-21 ENCOUNTER — Ambulatory Visit: Admitting: Sports Medicine

## 2024-03-24 ENCOUNTER — Ambulatory Visit (INDEPENDENT_AMBULATORY_CARE_PROVIDER_SITE_OTHER): Admitting: Sports Medicine

## 2024-03-24 ENCOUNTER — Encounter: Payer: Self-pay | Admitting: Sports Medicine

## 2024-03-24 DIAGNOSIS — M5412 Radiculopathy, cervical region: Secondary | ICD-10-CM | POA: Diagnosis not present

## 2024-03-24 NOTE — Assessment & Plan Note (Signed)
 Has had a couple of epidurals, she is desiring another, we will order it. Not taking Cymbalta , did not tolerate, she is back to Celexa . She will continue on occasional muscle relaxer. She is a candidate for breast reduction surgery, she did see the plastic surgeon, they are requesting 3 months of physical therapy before they will approve it.

## 2024-03-24 NOTE — Progress Notes (Signed)
    Procedures performed today:    None.  Independent interpretation of notes and tests performed by another provider:   None.  Brief History, Exam, Impression, and Recommendations:    Cervical radiculopathy Has had a couple of epidurals, she is desiring another, we will order it. Not taking Cymbalta , did not tolerate, she is back to Celexa . She will continue on occasional muscle relaxer. She is a candidate for breast reduction surgery, she did see the plastic surgeon, they are requesting 3 months of physical therapy before they will approve it.    ____________________________________________ Joselyn Nicely. Sandy Crumb, M.D., ABFM., CAQSM., AME. Primary Care and Sports Medicine [place] [facility] [place] [facility]  Adjunct Professor of [facility] Medicine  [employer]  Restaurant manager, fast food

## 2024-03-28 DIAGNOSIS — K219 Gastro-esophageal reflux disease without esophagitis: Secondary | ICD-10-CM | POA: Diagnosis not present

## 2024-03-28 DIAGNOSIS — K582 Mixed irritable bowel syndrome: Secondary | ICD-10-CM | POA: Diagnosis not present

## 2024-04-03 ENCOUNTER — Other Ambulatory Visit: Payer: Self-pay | Admitting: Sports Medicine

## 2024-04-03 DIAGNOSIS — M5412 Radiculopathy, cervical region: Secondary | ICD-10-CM

## 2024-04-04 ENCOUNTER — Other Ambulatory Visit

## 2024-04-06 NOTE — Discharge Instructions (Signed)

## 2024-04-07 ENCOUNTER — Encounter (INDEPENDENT_AMBULATORY_CARE_PROVIDER_SITE_OTHER): Payer: Self-pay | Admitting: Sports Medicine

## 2024-04-07 ENCOUNTER — Ambulatory Visit
Admission: RE | Admit: 2024-04-07 | Discharge: 2024-04-07 | Disposition: A | Discharge status: Home / Self Care | Source: Ambulatory Visit | Attending: Sports Medicine | Admitting: Sports Medicine

## 2024-04-07 DIAGNOSIS — M5412 Radiculopathy, cervical region: Secondary | ICD-10-CM

## 2024-04-07 DIAGNOSIS — M50121 Cervical disc disorder at C4-C5 level with radiculopathy: Secondary | ICD-10-CM | POA: Diagnosis not present

## 2024-04-07 DIAGNOSIS — M50122 Cervical disc disorder at C5-C6 level with radiculopathy: Secondary | ICD-10-CM | POA: Diagnosis not present

## 2024-04-07 MED ORDER — TRIAMCINOLONE ACETONIDE 40 MG/ML IJ SUSP (RADIOLOGY)
60.0000 mg | Freq: Once | INTRAMUSCULAR | Status: AC
  Administered 2024-04-07: 60 mg via EPIDURAL

## 2024-04-07 MED ORDER — IOPAMIDOL (ISOVUE-M 300) INJECTION 61%
1.0000 mL | Freq: Once | INTRAMUSCULAR | Status: AC | PRN
  Administered 2024-04-07: 1 mL via EPIDURAL

## 2024-04-10 MED ORDER — GABAPENTIN 300 MG PO CAPS: 300.0000 mg | ORAL_CAPSULE | Freq: Every day | ORAL | 3 refills | Status: DC

## 2024-04-10 NOTE — Telephone Encounter (Signed)

## 2024-06-01 ENCOUNTER — Ambulatory Visit

## 2024-06-03 ENCOUNTER — Encounter: Payer: Self-pay | Admitting: Sports Medicine

## 2024-06-03 NOTE — Telephone Encounter (Signed)
 Video visit is an option

## 2024-06-06 ENCOUNTER — Ambulatory Visit (INDEPENDENT_AMBULATORY_CARE_PROVIDER_SITE_OTHER): Admitting: Sports Medicine

## 2024-06-06 DIAGNOSIS — F418 Other specified anxiety disorders: Secondary | ICD-10-CM | POA: Diagnosis not present

## 2024-06-06 DIAGNOSIS — M5412 Radiculopathy, cervical region: Secondary | ICD-10-CM | POA: Diagnosis not present

## 2024-06-06 DIAGNOSIS — Z0000 Encounter for general adult medical examination without abnormal findings: Secondary | ICD-10-CM

## 2024-06-06 MED ORDER — PREDNISONE 50 MG PO TABS: ORAL_TABLET | ORAL | 0 refills | Status: DC

## 2024-06-06 MED ORDER — ESCITALOPRAM OXALATE 20 MG PO TABS: 20.0000 mg | ORAL_TABLET | Freq: Every day | ORAL | 11 refills | Status: DC

## 2024-06-06 NOTE — Assessment & Plan Note (Signed)
 Back to Lexapro , doing well, increasing to 20 mg daily, there are some studies with efficacy up to 40 mg of Lexapro , will consider this in 6 to 8 weeks.

## 2024-06-06 NOTE — Progress Notes (Addendum)
    Procedures performed today:    None.  Independent interpretation of notes and tests performed by another provider:   None.  Brief History, Exam, Impression, and Recommendations:    Cervical radiculopathy Pleasant-year-old female, known cervical degenerative disc disease multilevel, she has lost a great deal of weight, she also did a consultation for breast reduction surgery, she never did surgery, the weight loss may have helped. We tried Cymbalta , she did not tolerate it so back to Lexapro . She has had a couple of cervical epidurals over the past year. The first ones helped, the most recent one in May was not effective. We will go up on her gabapentin , she will do 2-3 times a day, she will increase her home conditioning. Adding a pred burst. If insufficient improvement we will consider repeat epidural.   Depression with anxiety Back to Lexapro , doing well, increasing to 20 mg daily, there are some studies with efficacy up to 40 mg of Lexapro , will consider this in 6 to 8 weeks.  Annual physical exam I have agreed to take this patient on as primary, she will come back at some point in the future for fasting annual physical.    ____________________________________________ Debby PARAS. Curtis, M.D., ABFM., CAQSM., AME. Primary Care and Sports Medicine [facility]  Adjunct Professor of [facility] Medicine  [employer]  Restaurant manager, fast food

## 2024-06-06 NOTE — Addendum Note (Signed)
 Addended by: CURTIS DEBBY PARAS on: 06/06/2024 03:29 PM   Modules accepted: Orders

## 2024-06-06 NOTE — Assessment & Plan Note (Signed)
 I have agreed to take this patient on as primary, she will come back at some point in the future for fasting annual physical.

## 2024-06-06 NOTE — Assessment & Plan Note (Addendum)
 Pleasant-year-old female, known cervical degenerative disc disease multilevel, she has lost a great deal of weight, she also did a consultation for breast reduction surgery, she never did surgery, the weight loss may have helped. We tried Cymbalta , she did not tolerate it so back to Lexapro . She has had a couple of cervical epidurals over the past year. The first ones helped, the most recent one in May was not effective. We will go up on her gabapentin , she will do 2-3 times a day, she will increase her home conditioning. Adding a pred burst. If insufficient improvement we will consider repeat epidural.

## 2024-06-24 ENCOUNTER — Ambulatory Visit (INDEPENDENT_AMBULATORY_CARE_PROVIDER_SITE_OTHER): Admitting: Sports Medicine

## 2024-06-24 DIAGNOSIS — M5412 Radiculopathy, cervical region: Secondary | ICD-10-CM

## 2024-06-24 MED ORDER — PREGABALIN 50 MG PO CAPS: ORAL_CAPSULE | ORAL | 3 refills | Status: DC

## 2024-06-24 NOTE — Addendum Note (Signed)
 Addended by: CURTIS DEBBY PARAS on: 06/24/2024 02:07 PM   Modules accepted: Orders

## 2024-06-24 NOTE — Progress Notes (Signed)
    Procedures performed today:    None.  Independent interpretation of notes and tests performed by another provider:   None.  Brief History, Exam, Impression, and Recommendations:    Cervical radiculopathy Very pleasant 43 year old female, known cervical multilevel degenerative disc disease, she is lost a great deal of weight, congratulated. She did consultation for breast reduction surgery but never ended up doing the surgery, the weight loss has helped her to some degree overall. We tried Cymbalta , she did not tolerate it so we went to Lexapro , she is doing really well on Lexapro  20. Gabapentin  at 300 twice a day has been moderately effective although creating excessive drowsiness. She has also had a few epidurals, the last of which was earlier this year. Due to persistent pain and excessive drowsiness with gabapentin  with lack of gabapentin  efficacy we will switch to Lyrica  and up titration, also add another cervical epidural. Return to see me about 4 weeks.    ____________________________________________ Debby PARAS. Curtis, M.D., ABFM., CAQSM., AME. Primary Care and Sports Medicine [facility]  Adjunct Professor of [facility] Medicine  [employer]  Restaurant manager, fast food

## 2024-06-24 NOTE — Assessment & Plan Note (Signed)
 Very pleasant 43 year old female, known cervical multilevel degenerative disc disease, she is lost a great deal of weight, congratulated. She did consultation for breast reduction surgery but never ended up doing the surgery, the weight loss has helped her to some degree overall. We tried Cymbalta , she did not tolerate it so we went to Lexapro , she is doing really well on Lexapro  20. Gabapentin  at 300 twice a day has been moderately effective although creating excessive drowsiness. She has also had a few epidurals, the last of which was earlier this year. Due to persistent pain and excessive drowsiness with gabapentin  with lack of gabapentin  efficacy we will switch to Lyrica  and up titration, also add another cervical epidural. Return to see me about 4 weeks.

## 2024-07-04 NOTE — Discharge Instructions (Signed)

## 2024-07-05 ENCOUNTER — Ambulatory Visit
Admission: RE | Admit: 2024-07-05 | Discharge: 2024-07-05 | Disposition: A | Discharge status: Home / Self Care | Source: Ambulatory Visit | Attending: Sports Medicine | Admitting: Sports Medicine

## 2024-07-05 DIAGNOSIS — M4722 Other spondylosis with radiculopathy, cervical region: Secondary | ICD-10-CM | POA: Diagnosis not present

## 2024-07-05 DIAGNOSIS — M5412 Radiculopathy, cervical region: Secondary | ICD-10-CM

## 2024-07-05 MED ORDER — TRIAMCINOLONE ACETONIDE 40 MG/ML IJ SUSP (RADIOLOGY)
60.0000 mg | Freq: Once | INTRAMUSCULAR | Status: AC
Start: 2024-07-05 — End: 2024-07-05
  Administered 2024-07-05: 60 mg via EPIDURAL

## 2024-07-05 MED ORDER — IOPAMIDOL (ISOVUE-M 300) INJECTION 61%
1.0000 mL | Freq: Once | INTRAMUSCULAR | Status: AC
  Administered 2024-07-05: 1 mL via EPIDURAL

## 2024-07-19 ENCOUNTER — Ambulatory Visit: Admitting: Sports Medicine

## 2024-07-19 ENCOUNTER — Encounter: Payer: Self-pay | Admitting: Sports Medicine

## 2024-07-22 ENCOUNTER — Ambulatory Visit: Admitting: Sports Medicine

## 2024-08-15 ENCOUNTER — Telehealth: Admitting: Physician Assistant

## 2024-08-15 DIAGNOSIS — R070 Pain in throat: Secondary | ICD-10-CM

## 2024-08-15 DIAGNOSIS — R220 Localized swelling, mass and lump, head: Secondary | ICD-10-CM

## 2024-08-16 ENCOUNTER — Ambulatory Visit
Admission: RE | Admit: 2024-08-16 | Discharge: 2024-08-16 | Disposition: A | Discharge status: Home / Self Care | Attending: Family Medicine | Admitting: Family Medicine

## 2024-08-16 VITALS — BP 99/70 | HR 84 | Temp 99.2°F | Resp 17

## 2024-08-16 DIAGNOSIS — K122 Cellulitis and abscess of mouth: Secondary | ICD-10-CM

## 2024-08-16 DIAGNOSIS — J029 Acute pharyngitis, unspecified: Secondary | ICD-10-CM | POA: Diagnosis not present

## 2024-08-16 DIAGNOSIS — R220 Localized swelling, mass and lump, head: Secondary | ICD-10-CM

## 2024-08-16 LAB — POCT RAPID STREP A (OFFICE): Rapid Strep A Screen: NEGATIVE

## 2024-08-16 MED ORDER — PREDNISONE 20 MG PO TABS: ORAL_TABLET | ORAL | 0 refills | Status: DC

## 2024-08-16 MED ORDER — AMOXICILLIN-POT CLAVULANATE 875-125 MG PO TABS
1.0000 | ORAL_TABLET | Freq: Two times a day (BID) | ORAL | 0 refills | Status: AC
Start: 2024-08-16 — End: ?

## 2024-08-16 NOTE — Discharge Instructions (Addendum)
 Advised patient to take medications as directed with food to completion.  Advised patient to take prednisone  with first dose of Augmentin  for the next 5 of 7 days.  Encouraged to increase daily water intake to 64 ounces per day.  Advised patient if symptoms worsen and/or unresolved please go to nearest ED for further evaluation with CT of the neck to rule out tonsillar abscess.

## 2024-08-16 NOTE — Progress Notes (Signed)
  Because of severity of tongue swelling and need for both further exam/swabbing and likely steroid shot to quickly reduce swelling, I feel your condition warrants further evaluation and I recommend that you be seen in a face-to-face visit.   NOTE: There will be NO CHARGE for this E-Visit   If you are having a true medical emergency, please call 911.     For an urgent face to face visit, [facility] has multiple urgent care centers for your convenience.  Click the link below for the full list of locations and hours, walk-in wait times, appointment scheduling options and driving directions:  Urgent Care - [place], [place], [place], [place], [place], [place]  [place]     Your MyChart E-visit questionnaire answers were reviewed by a board certified advanced clinical practitioner to complete your personal care plan based on your specific symptoms.    Thank you for using e-Visits.

## 2024-08-16 NOTE — ED Triage Notes (Signed)
 Pt c/o sore throat, as well as throat and tongue swelling since waking up yesterday. Hard to swallow solid food. Mucinex, nyquil, naproxen  and benedryl prn. Had old amoxicillin  rx she's been taking since last night (2 doses).

## 2024-08-16 NOTE — ED Provider Notes (Signed)
 [facility]    CSN: [id number] Arrival date & time: 08/16/24  1402      History   Chief Complaint Chief Complaint  Patient presents with   Sore Throat   Oral Swelling    Tongue    HPI Kristen Gibbs is a 43 y.o. female.   HPI 42 year old female presents with swollen tongue and sore throat for 2 days.  Reports that she cannot swallow solid foods.  PMH significant for cervical and lumbar radiculopathy and polyarthralgia.  Additionally, patient reports taking old prescribed amoxicillin  leftover from last 2 doses of previous prescription.  Past Medical History:  Diagnosis Date   Cervical radiculopathy 03/31/2022   Lumbar radiculopathy 02/24/2022    Patient Active Problem List   Diagnosis Date Noted   Annual physical exam 06/06/2024   Macromastia 12/14/2023   Polyarthralgia 10/23/2023   Obesity (BMI 30.0-34.9) 04/22/2023   Depression with anxiety 08/20/2022   Hot flashes 05/28/2022   Urinary incontinence 05/28/2022   Irritable bowel syndrome with diarrhea 05/28/2022   Cervical radiculopathy 03/31/2022   Lumbar radiculopathy 02/24/2022    Past Surgical History:  Procedure Laterality Date   LEEP      OB History     Gravida  2   Para  1   Term  1   Preterm      AB  1   Living  1      SAB  1   IAB      Ectopic      Multiple      Live Births  1            Home Medications    Prior to Admission medications   Medication Sig Start Date End Date Taking? Authorizing Provider  amoxicillin -clavulanate (AUGMENTIN ) 875-125 MG tablet Take 1 tablet by mouth every 12 (twelve) hours. 08/16/24  Yes Teddy Sharper, FNP  predniSONE  (DELTASONE ) 20 MG tablet Take 3 tabs PO daily x 5 days. 08/16/24  Yes Teddy Sharper, FNP  buPROPion  (WELLBUTRIN ) 75 MG tablet TAKE 1 TABLET BY MOUTH TWICE A DAY 12/21/22   O'Sullivan, Melissa, NP  celecoxib  (CELEBREX ) 200 MG capsule ONE TO 2 TABLETS BY MOUTH DAILY AS NEEDED FOR PAIN. 02/08/24   Curtis Debby PARAS, MD   cetirizine  (ZYRTEC ) 10 MG tablet TAKE 1 TABLET BY MOUTH EVERY DAY 10/02/22   O'Sullivan, Melissa, NP  cyclobenzaprine  (FLEXERIL ) 10 MG tablet Take 1 tablet (10 mg total) by mouth at bedtime as needed for muscle spasms. 05/15/23   Mayer, Jodi R, NP  escitalopram  (LEXAPRO ) 20 MG tablet Take 1 tablet (20 mg total) by mouth daily. 06/06/24   Curtis Debby PARAS, MD  fluticasone  (FLONASE ) 50 MCG/ACT nasal spray Place 2 sprays into both nostrils daily. 01/06/23   Moishe Chiquita HERO, NP  lidocaine  (LIDODERM ) 5 % Place 1 patch onto the skin every 12 (twelve) hours. Remove & Discard patch within 12 hours or as directed by MD 10/14/22   Chick Venetia BRAVO, MD  pregabalin  (LYRICA ) 50 MG capsule 1 capsule p.o. nightly for a 4 days then twice daily for a 4 days then 2 capsules p.o. twice daily if needed 06/24/24   Curtis Debby PARAS, MD    Family History Family History  Problem Relation Age of Onset   Hypertension Mother    Hashimoto's thyroiditis Mother    Hypertension Father    Diabetes Maternal Grandmother    Hashimoto's thyroiditis Maternal Aunt    Diabetes Maternal Aunt     Social  History Social History   Tobacco Use   Smoking status: Former    Current packs/day: 0.00    Average packs/day: 0.5 packs/day for 10.0 years (5.0 ttl pk-yrs)    Types: Cigarettes    Start date: 08/20/2004    Quit date: 08/22/2014    Years since quitting: 9.9   Smokeless tobacco: Never  Vaping Use   Vaping status: Never Used  Substance Use Topics   Alcohol use: Not Currently    Alcohol/week: 1.0 standard drink of alcohol    Types: 1 Cans of beer per week    Comment: soc   Drug use: Never     Allergies   Patient has no known allergies.   Review of Systems Review of Systems  HENT:  Positive for sore throat.      Physical Exam Triage Vital Signs ED Triage Vitals  Encounter Vitals Group     BP      Girls Systolic BP Percentile      Girls Diastolic BP Percentile      Boys Systolic BP Percentile       Boys Diastolic BP Percentile      Pulse      Resp      Temp      Temp src      SpO2      Weight      Height      Head Circumference      Peak Flow      Pain Score      Pain Loc      Pain Education      Exclude from Growth Chart    No data found.  Updated Vital Signs BP 99/70 (BP Location: Right Arm)   Pulse 84   Temp 99.2 F (37.3 C) (Oral)   Resp 17   LMP  (LMP Unknown)   SpO2 97%   Visual Acuity Right Eye Distance:   Left Eye Distance:   Bilateral Distance:    Right Eye Near:   Left Eye Near:    Bilateral Near:     Physical Exam Vitals and nursing note reviewed.  Constitutional:      Appearance: Normal appearance. She is well-developed and normal weight. She is ill-appearing.  HENT:     Head: Normocephalic and atraumatic.     Left Ear: Tympanic membrane, ear canal and external ear normal.     Ears:     Comments: Right ear: Patient deferred right ear exam today due to recent earring piercings    Mouth/Throat:     Mouth: Mucous membranes are moist.     Pharynx: Oropharynx is clear. Uvula midline. Posterior oropharyngeal erythema and uvula swelling present.     Tonsils: 2+ on the right. 2+ on the left.  Eyes:     Extraocular Movements: Extraocular movements intact.     Conjunctiva/sclera: Conjunctivae normal.     Pupils: Pupils are equal, round, and reactive to light.  Cardiovascular:     Rate and Rhythm: Normal rate and regular rhythm.     Heart sounds: Normal heart sounds. No murmur heard. Pulmonary:     Effort: Pulmonary effort is normal.     Breath sounds: Normal breath sounds. No wheezing, rhonchi or rales.  Musculoskeletal:        General: Normal range of motion.  Skin:    General: Skin is warm and dry.  Neurological:     General: No focal deficit present.     Mental Status: She is  alert and oriented to person, place, and time.  Psychiatric:        Mood and Affect: Mood normal.        Behavior: Behavior normal.      UC Treatments /  Results  Labs (all labs ordered are listed, but only abnormal results are displayed) Labs Reviewed  CULTURE, GROUP A STREP [facility])  POCT RAPID STREP A (OFFICE)    EKG   Radiology No results found.  Procedures Procedures (including critical care time)  Medications Ordered in UC Medications - No data to display  Initial Impression / Assessment and Plan / UC Course  I have reviewed the triage vital signs and the nursing notes.  Pertinent labs & imaging results that were available during my care of the patient were reviewed by me and considered in my medical decision making (see chart for details).     MDM: 1.  Uvulitis-Rx'd Augmentin  875/125 mg tablet: Take 1 tablet twice daily x 7 days; 2.  Sore throat-Rx'd prednisone  20 mg tablet: Take 3 tablets p.o. daily x 5 days. Advised patient to take medications as directed with food to completion.  Advised patient to take prednisone  with first dose of Augmentin  for the next 5 of 7 days.  Encouraged to increase daily water intake to 64 ounces per day.  Advised patient if symptoms worsen and/or unresolved please go to nearest ED for further evaluation with CT of the neck to rule out tonsillar abscess.  Patient discharged home, hemodynamically stable. Final Clinical Impressions(s) / UC Diagnoses   Final diagnoses:  Sore throat  Uvulitis     Discharge Instructions      Advised patient to take medications as directed with food to completion.  Advised patient to take prednisone  with first dose of Augmentin  for the next 5 of 7 days.  Encouraged to increase daily water intake to 64 ounces per day.  Advised patient if symptoms worsen and/or unresolved please go to nearest ED for further evaluation with CT of the neck to rule out tonsillar abscess.     ED Prescriptions     Medication Sig Dispense Auth. Provider   amoxicillin -clavulanate (AUGMENTIN ) 875-125 MG tablet Take 1 tablet by mouth every 12 (twelve) hours. 14 tablet Jeselle Hiser,  FNP   predniSONE  (DELTASONE ) 20 MG tablet Take 3 tabs PO daily x 5 days. 15 tablet Modupe Shampine, FNP      PDMP not reviewed this encounter.   Teddy Sharper, FNP 08/16/24 1505

## 2024-08-18 LAB — CULTURE, GROUP A STREP (THRC)

## 2024-09-20 ENCOUNTER — Encounter

## 2024-09-21 ENCOUNTER — Other Ambulatory Visit: Payer: Self-pay | Admitting: Family Medicine

## 2024-09-21 ENCOUNTER — Other Ambulatory Visit: Payer: Self-pay

## 2024-09-21 NOTE — Telephone Encounter (Signed)
 Copied from CRM [id number]. Topic: Clinical - Medication Question >> Sep 21, 2024  1:33 PM Larissa S wrote: Reason for CRM: Patient requesting a refill on Wellbutrin . Former PCP was Dr. Curtis. She has been scheduled for a TOC on 12/26 and added to waitlist. States she is out of medication and really needs refill.   [id number] - [place], [place] - [address] [place] [address] Phone: [phone] Fax: [phone] Hours: Not open 24 hours

## 2024-09-21 NOTE — Addendum Note (Signed)
 Addended by: BONNY JON DEL on: 09/21/2024 02:41 PM   Modules accepted: Orders

## 2024-09-22 NOTE — Telephone Encounter (Signed)
 Attempted call to patient. Left a voice mail message requesting a return call.

## 2024-11-03 ENCOUNTER — Encounter: Payer: Self-pay | Admitting: Urgent Care

## 2024-11-03 ENCOUNTER — Ambulatory Visit: Admitting: Urgent Care

## 2024-11-03 VITALS — BP 114/73 | HR 76 | Ht 66.0 in | Wt 167.0 lb

## 2024-11-03 DIAGNOSIS — M5416 Radiculopathy, lumbar region: Secondary | ICD-10-CM | POA: Diagnosis not present

## 2024-11-03 DIAGNOSIS — F418 Other specified anxiety disorders: Secondary | ICD-10-CM

## 2024-11-03 DIAGNOSIS — Z3044 Encounter for surveillance of vaginal ring hormonal contraceptive device: Secondary | ICD-10-CM | POA: Diagnosis not present

## 2024-11-03 DIAGNOSIS — M5126 Other intervertebral disc displacement, lumbar region: Secondary | ICD-10-CM

## 2024-11-03 DIAGNOSIS — M5412 Radiculopathy, cervical region: Secondary | ICD-10-CM

## 2024-11-03 DIAGNOSIS — Z1231 Encounter for screening mammogram for malignant neoplasm of breast: Secondary | ICD-10-CM

## 2024-11-03 MED ORDER — BUPROPION HCL 75 MG PO TABS: 75.0000 mg | ORAL_TABLET | Freq: Two times a day (BID) | ORAL | 1 refills | Status: AC

## 2024-11-03 MED ORDER — ESCITALOPRAM OXALATE 20 MG PO TABS: 20.0000 mg | ORAL_TABLET | Freq: Every day | ORAL | 11 refills | Status: AC

## 2024-11-03 MED ORDER — GABAPENTIN 100 MG PO CAPS: 100.0000 mg | ORAL_CAPSULE | Freq: Every day | ORAL | 3 refills | Status: AC

## 2024-11-03 MED ORDER — ELURYNG 0.12-0.015 MG/24HR VA RING: VAGINAL_RING | VAGINAL | 6 refills | Status: AC

## 2024-11-03 NOTE — Patient Instructions (Signed)
 Dr. Beuford [address], [place], [place] [address] Phone: [phone]  I have refilled your medications.  Please schedule a follow up in 6-8 weeks with fasting labs.

## 2024-11-03 NOTE — Progress Notes (Signed)
 Established Patient Office Visit  Subjective:  Patient ID: Kristen Gibbs, female    DOB: [birth year]  Age: 43 y.o. MRN: [id number]  Chief Complaint  Patient presents with   Transfer of care    refills    HPI  Discussed the use of AI scribe software for clinical note transcription with the patient, who gave verbal consent to proceed.  History of Present Illness   Kristen Gibbs is a 43 year old female who presents for pain management and medication refills.  She experiences chronic neck pain, partially relieved by injections that last for a couple of months. The neck pain is associated with numbness in her arm.  Her lower back pain has been persistent for years, with worsening symptoms over the last couple of years. The pain is described as shooting up her back and down her spine, with an aching sensation in her buttocks. She has a history of a disc rupture between L5 and S1, confirmed by an MRI two and a half years ago, impacting the right nerve root and causing radiating pain down her right leg. She has not had recent injections in her lower back and is reluctant towards hip injections.  She attempted physical therapy, but insurance limitations allowed treatment for only one part of her spine at a time, which was not effective. She continues to perform stretches and exercises at home and has lost weight, which she believes has helped her joints and spine to some extent.  Her current medications include gabapentin , which she finds somewhat helpful, and Wellbutrin  at 75 mg, which she has been on for years. She recently switched back to gabapentin  from citalopram  due to adverse effects like night sweats and night terrors. She also uses a vaginal ring as her preferred form of birth control.  She has lost weight through exercise, diet, and portion control, losing about 40 pounds. No breast lumps, problems, or pain, except for tenderness during her menstrual cycle.      Patient Active  Problem List   Diagnosis Date Noted   Annual physical exam 06/06/2024   Macromastia 12/14/2023   Polyarthralgia 10/23/2023   Obesity (BMI 30.0-34.9) 04/22/2023   Depression with anxiety 08/20/2022   Hot flashes 05/28/2022   Urinary incontinence 05/28/2022   Irritable bowel syndrome with diarrhea 05/28/2022   Cervical radiculopathy 03/31/2022   Lumbar radiculopathy 02/24/2022   Past Medical History:  Diagnosis Date   Cervical radiculopathy 03/31/2022   Lumbar radiculopathy 02/24/2022   Past Surgical History:  Procedure Laterality Date   LEEP     Social History[1]    ROS: as noted in HPI  Objective:     BP 114/73   Pulse 76   Ht 5' 6 (1.676 m)   Wt 167 lb (75.8 kg)   SpO2 99%   BMI 26.95 kg/m  BP Readings from Last 3 Encounters:  11/03/24 114/73  08/16/24 99/70  07/05/24 105/66   Wt Readings from Last 3 Encounters:  11/03/24 167 lb (75.8 kg)  02/17/24 186 lb 3.2 oz (84.5 kg)  09/29/23 210 lb (95.3 kg)      Physical Exam Vitals and nursing note reviewed.  Constitutional:      General: She is not in acute distress.    Appearance: Normal appearance. She is not ill-appearing, toxic-appearing or diaphoretic.  HENT:     Head: Normocephalic and atraumatic.  Eyes:     General: No scleral icterus.       Right eye: No discharge.  Left eye: No discharge.     Extraocular Movements: Extraocular movements intact.     Pupils: Pupils are equal, round, and reactive to light.  Cardiovascular:     Rate and Rhythm: Normal rate.  Pulmonary:     Effort: Pulmonary effort is normal. No respiratory distress.  Skin:    General: Skin is warm and dry.     Coloration: Skin is not jaundiced.     Findings: No bruising, erythema or rash.  Neurological:     General: No focal deficit present.     Mental Status: She is alert and oriented to person, place, and time.     Gait: Gait normal.  Psychiatric:        Mood and Affect: Mood normal.        Behavior: Behavior normal.       No results found for any visits on 11/03/24.  Last CBC Lab Results  Component Value Date   WBC 7.3 12/06/2021   HGB 12.9 12/06/2021   HCT 38.9 12/06/2021   MCV 92.8 12/06/2021   MCH 30.8 12/06/2021   RDW 11.8 12/06/2021   PLT 296 12/06/2021   Last metabolic panel Lab Results  Component Value Date   GLUCOSE 88 12/06/2021   NA 139 12/06/2021   K 4.2 12/06/2021   CL 104 12/06/2021   CO2 26 12/06/2021   BUN 14 12/06/2021   CREATININE 0.92 12/06/2021   CALCIUM 10.0 12/06/2021   PROT 8.0 12/06/2021   BILITOT 0.4 12/06/2021   AST 25 12/06/2021   ALT 26 12/06/2021   Last lipids Lab Results  Component Value Date   CHOL 149 12/06/2021   HDL 53 12/06/2021   LDLCALC 82 12/06/2021   TRIG 63 12/06/2021   CHOLHDL 2.8 12/06/2021   Last hemoglobin A1c No results found for: HGBA1C Last thyroid functions Lab Results  Component Value Date   TSH 2.02 12/06/2021   FREET4 1.0 12/06/2021   Last vitamin D Lab Results  Component Value Date   VD25OH 31 12/06/2021   Last vitamin B12 and Folate Lab Results  Component Value Date   VITAMINB12 495 12/06/2021      The 10-year ASCVD risk score (Arnett DK, et al., 2019) is: 0.3%  Assessment & Plan:  Lumbar disc herniation -     Ambulatory referral to Orthopedics  Cervical radiculopathy -     buPROPion  HCl; Take 1 tablet (75 mg total) by mouth 2 (two) times daily.  Dispense: 180 tablet; Refill: 1 -     Escitalopram  Oxalate; Take 1 tablet (20 mg total) by mouth daily.  Dispense: 30 tablet; Refill: 11 -     Gabapentin ; Take 1 capsule (100 mg total) by mouth at bedtime.  Dispense: 90 capsule; Refill: 3  Depression with anxiety -     Escitalopram  Oxalate; Take 1 tablet (20 mg total) by mouth daily.  Dispense: 30 tablet; Refill: 11 -     Gabapentin ; Take 1 capsule (100 mg total) by mouth at bedtime.  Dispense: 90 capsule; Refill: 3  Lumbar radiculopathy -     buPROPion  HCl; Take 1 tablet (75 mg total) by mouth 2 (two)  times daily.  Dispense: 180 tablet; Refill: 1 -     Gabapentin ; Take 1 capsule (100 mg total) by mouth at bedtime.  Dispense: 90 capsule; Refill: 3 -     Ambulatory referral to Interventional Radiology  Encounter for surveillance of vaginal ring hormonal contraceptive device -     EluRyng ; Insert vaginally and  leave in place for 3 consecutive weeks, then remove for 1 week.  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 Gabapentin  provides partial relief. - Referred to Dr. Beuford for further evaluation. - Ordered repeat injection for symptom relief - L5 epidural - Continue gabapentin .  Cervical radiculopathy Osteophytes causing nerve root irritation, numbness in arm, hesitant about surgery. - Continue injections as needed.  Depression with anxiety Managed with bupropion  and gabapentin , current regimen effective and well-tolerated. - Continue bupropion  75 mg daily. - Continue gabapentin .  General health maintenance Routine health maintenance discussed, Pap smear negative, mammogram to start at age 70. - Scheduled follow-up for annual physical and labs. - Discussed mammogram screening starting at age 70. Order was placed back in April, pt just needs to schedule.        Return in about 8 weeks (around 12/29/2024) for Annual Physical.   Benton LITTIE Gave, PA     [1]  Social History Tobacco Use   Smoking status: Former    Current packs/day: 0.00    Average packs/day: 0.5 packs/day for 10.0 years (5.0 ttl pk-yrs)    Types: Cigarettes    Start date: 08/20/2004    Quit date: 08/22/2014    Years since quitting: 10.2   Smokeless tobacco: Never  Vaping Use   Vaping status: Never Used  Substance Use Topics   Alcohol use: Not Currently    Alcohol/week: 1.0 standard drink of alcohol     Types: 1 Cans of beer per week    Comment: soc   Drug use: Never

## 2024-11-04 ENCOUNTER — Other Ambulatory Visit: Payer: Self-pay | Admitting: Urgent Care

## 2024-11-04 DIAGNOSIS — M5416 Radiculopathy, lumbar region: Secondary | ICD-10-CM

## 2024-11-08 ENCOUNTER — Encounter: Payer: Self-pay | Admitting: Urgent Care

## 2024-11-11 ENCOUNTER — Encounter: Admitting: Urgent Care

## 2024-11-11 NOTE — Discharge Instructions (Signed)

## 2024-11-14 ENCOUNTER — Ambulatory Visit
Admission: RE | Admit: 2024-11-14 | Discharge: 2024-11-14 | Disposition: A | Source: Ambulatory Visit | Attending: Urgent Care | Admitting: Urgent Care

## 2024-11-14 DIAGNOSIS — M5416 Radiculopathy, lumbar region: Secondary | ICD-10-CM

## 2024-11-14 MED ORDER — IOPAMIDOL (ISOVUE-M 200) INJECTION 41%
1.0000 mL | Freq: Once | INTRAMUSCULAR | Status: AC
  Administered 2024-11-14: 1 mL via EPIDURAL

## 2024-11-14 MED ORDER — METHYLPREDNISOLONE ACETATE 40 MG/ML INJ SUSP (RADIOLOG
80.0000 mg | Freq: Once | INTRAMUSCULAR | Status: AC
  Administered 2024-11-14: 80 mg via EPIDURAL

## 2024-11-29 ENCOUNTER — Telehealth: Payer: Self-pay

## 2024-11-29 DIAGNOSIS — M5126 Other intervertebral disc displacement, lumbar region: Secondary | ICD-10-CM

## 2024-11-29 DIAGNOSIS — M5416 Radiculopathy, lumbar region: Secondary | ICD-10-CM

## 2024-11-29 NOTE — Telephone Encounter (Signed)
 LM to inquire if patient has tried PT in the past. I didn't see in her chart where she has.

## 2024-11-29 NOTE — Telephone Encounter (Signed)
 Copied from CRM [id number]. Topic: Referral - Prior Authorization Question >> Nov 28, 2024  4:23 PM Everette C wrote: Reason for CRM: The patient has been told by [facility] that their insurance company states they will need physical therapy for coverage of their MRI unless they can provide verification from their PCP that they've been previously seen for physical therapy. The patient has been told that information can be submitted to   [email or web address] Fax [phone]  Phone [phone]  Please contact the patient further if needed

## 2024-11-29 NOTE — Telephone Encounter (Signed)
 Copied from CRM [id number]. Topic: Referral - Prior Authorization Question >> Nov 28, 2024  4:23 PM Everette C wrote: Reason for CRM: The patient has been told by [facility] that their insurance company states they will need physical therapy for coverage of their MRI unless they can provide verification from their PCP that they've been previously seen for physical therapy. The patient has been told that information can be submitted to   [email or web address] Fax [phone]  Phone [phone]  Please contact the patient further if needed >> Nov 29, 2024  1:16 PM Emylou G wrote: Patient called.. She adv that Dr T.. saw him and he prescribed PT 06/24/2024 and 07/19/2024..( she did at home PT due to medications instead because she couldn't drive ) .SABRA There should be notes from a Dr Claudene, where she did outpatient pt therapy which was in 2023 [facility] ).. She also only did therapy a few times due to insurance issue.  Pls call her back if you need anything else

## 2024-11-29 NOTE — Telephone Encounter (Signed)
 Patient has addendum to previous message.

## 2024-11-29 NOTE — Telephone Encounter (Signed)
 She did back in 2022 at [facility]. 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

## 2024-12-01 NOTE — Addendum Note (Signed)
 Addended by: Nataleah Scioneaux on: 12/01/2024 05:25 AM   Modules accepted: Orders

## 2024-12-08 ENCOUNTER — Institutional Professional Consult (permissible substitution)

## 2024-12-11 ENCOUNTER — Telehealth: Admitting: Family

## 2024-12-11 DIAGNOSIS — R051 Acute cough: Secondary | ICD-10-CM | POA: Diagnosis not present

## 2024-12-11 DIAGNOSIS — R509 Fever, unspecified: Secondary | ICD-10-CM | POA: Diagnosis not present

## 2024-12-11 DIAGNOSIS — R6889 Other general symptoms and signs: Secondary | ICD-10-CM

## 2024-12-11 MED ORDER — OSELTAMIVIR PHOSPHATE 75 MG PO CAPS: 75.0000 mg | ORAL_CAPSULE | Freq: Two times a day (BID) | ORAL | 0 refills | Status: AC

## 2024-12-11 MED ORDER — IPRATROPIUM BROMIDE 0.06 % NA SOLN: 2.0000 | Freq: Four times a day (QID) | NASAL | 1 refills | Status: AC

## 2024-12-11 NOTE — Progress Notes (Signed)
 E visit for Flu like symptoms   We are sorry that you are not feeling well.  Here is how we plan to help! Based on what you have shared with me it looks like you may have a respiratory virus that may be influenza.  Influenza or the flu is  an infection caused by a respiratory virus. The flu virus is highly contagious and persons who did not receive their yearly flu vaccination may catch the flu from close contact.  We have anti-viral medications to treat the viruses that cause this infection. They are not a cure and only shorten the course of the infection. These prescriptions are most effective when they are given within the first 2 days of flu symptoms. Antiviral medications are indicated if you have a high risk of complications from the flu. You should  also consider an antiviral medication if you are in close contact with someone who is at risk. These medications can help patients avoid complications from the flu but have side effects that you should know.   Possible side effects from Tamiflu  or oseltamivir  include nausea, vomiting, diarrhea, dizziness, headaches, eye redness, sleep problems or other respiratory symptoms. You should not take Tamiflu  if you have an allergy to oseltamivir  or any to the ingredients in Tamiflu .  Based upon your symptoms and potential risk factors I have prescribed Oseltamivir  (Tamiflu ).  It has been sent to your designated pharmacy.  You will take one 75 mg capsule orally twice a day for the next 5 days.   For nasal congestion, you may use an oral decongestant such as Mucinex D or if you have glaucoma or high blood pressure use plain Mucinex.  Saline nasal spray or nasal drops can help and can safely be used as often as needed for congestion.  If you have a sore or scratchy throat, use a saltwater gargle-  to  teaspoon of salt dissolved in a 4-ounce to 8-ounce glass of warm water.  Gargle the solution for approximately 15-30 seconds and then spit.  It is  important not to swallow the solution.  You can also use throat lozenges/cough drops and Chloraseptic spray to help with throat pain or discomfort.  Warm or cold liquids can also be helpful in relieving throat pain.  For headache, pain or general discomfort, you can use Ibuprofen or Tylenol as directed.   Some authorities believe that zinc sprays or the use of Echinacea may shorten the course of your symptoms.  I have prescribed the following medications to help lessen symptoms: I have prescribed Tessalon  Perles 100 mg. You may take 1-2 capsules every 8 hours as needed for cough. I also sent ipratropium nasal spray that you can use two sprays into both nostrils 4 times a day as needed.   You are to isolate at home until you have been fever-free for at least 24 hours without a fever-reducing medication, and symptoms have been steadily improving for 24 hours.  If you must be around other household members who do not have symptoms, you need to make sure that both you and the family members are masking consistently with a high-quality mask.  If you note any worsening of symptoms despite treatment, please seek an in-person evaluation ASAP. If you note any significant shortness of breath or any chest pain, please seek ED evaluation. Please do not delay care!  ANYONE WHO HAS FLU SYMPTOMS SHOULD: Stay home. The flu is highly contagious and going out or to work exposes others! Be sure  to drink plenty of fluids. Water is fine as well as fruit juices, sodas and electrolyte beverages. You may want to stay away from caffeine or alcohol. If you are nauseated, try taking small sips of liquids. How do you know if you are getting enough fluid? Your urine should be a pale yellow or almost colorless. Get rest. Taking a steamy shower or using a humidifier may help nasal congestion and ease sore throat pain. Using a saline nasal spray works much the same way. Cough drops, hard candies and sore throat lozenges may ease  your cough. Line up a caregiver. Have someone check on you regularly.  GET HELP RIGHT AWAY IF: You cannot keep down liquids or your medications. You become short of breath Your fell like you are going to pass out or loose consciousness. Your symptoms persist after you have completed your treatment plan  MAKE SURE YOU  Understand these instructions. Will watch your condition. Will get help right away if you are not doing well or get worse.  Your e-visit answers were reviewed by a board certified advanced clinical practitioner to complete your personal care plan.  Depending on the condition, your plan could have included both over the counter or prescription medications.  If there is a problem please reply  once you have received a response from your provider.  Your safety is important to us .  If you have drug allergies check your prescription carefully.    You can use MyChart to ask questions about todays visit, request a non-urgent call back, or ask for a work or school excuse for 24 hours related to this e-Visit. If it has been greater than 24 hours you will need to follow up with your provider, or enter a new e-Visit to address those concerns.  You will get an e-mail in the next two days asking about your experience.  I hope that your e-visit has been valuable and will speed your recovery. Thank you for using e-visits.   I have spent 5 minutes in review of e-visit questionnaire, review and updating patient chart, medical decision making and response to patient.   Bari Learn, FNP

## 2024-12-28 ENCOUNTER — Institutional Professional Consult (permissible substitution): Payer: Self-pay

## 2025-01-03 ENCOUNTER — Encounter: Admitting: Urgent Care

## 2025-01-05 ENCOUNTER — Ambulatory Visit: Admitting: Urgent Care
